# Patient Record
Sex: Male | Born: 1955 | Race: White | Hispanic: No | Marital: Married | Smoking: Current some day smoker
Health system: Southern US, Community
[De-identification: ages and names within clinical notes are randomized; demographics above are authoritative.]

## PROBLEM LIST (undated history)

## (undated) DIAGNOSIS — M1990 Unspecified osteoarthritis, unspecified site: Secondary | ICD-10-CM

## (undated) DIAGNOSIS — N529 Male erectile dysfunction, unspecified: Secondary | ICD-10-CM

## (undated) DIAGNOSIS — I10 Essential (primary) hypertension: Secondary | ICD-10-CM

## (undated) DIAGNOSIS — K766 Portal hypertension: Secondary | ICD-10-CM

## (undated) DIAGNOSIS — K7460 Unspecified cirrhosis of liver: Secondary | ICD-10-CM

## (undated) DIAGNOSIS — G4730 Sleep apnea, unspecified: Secondary | ICD-10-CM

## (undated) DIAGNOSIS — D126 Benign neoplasm of colon, unspecified: Secondary | ICD-10-CM

## (undated) DIAGNOSIS — L309 Dermatitis, unspecified: Secondary | ICD-10-CM

## (undated) DIAGNOSIS — I864 Gastric varices: Secondary | ICD-10-CM

## (undated) DIAGNOSIS — I8500 Esophageal varices without bleeding: Secondary | ICD-10-CM

## (undated) DIAGNOSIS — E785 Hyperlipidemia, unspecified: Secondary | ICD-10-CM

## (undated) HISTORY — DX: Essential (primary) hypertension: I10

## (undated) HISTORY — DX: Sleep apnea, unspecified: G47.30

## (undated) HISTORY — DX: Portal hypertension: K76.6

## (undated) HISTORY — DX: Unspecified cirrhosis of liver: K74.60

## (undated) HISTORY — DX: Morbid (severe) obesity due to excess calories: E66.01

## (undated) HISTORY — PX: INCISE AND DRAIN ABCESS: PRO64

## (undated) HISTORY — PX: TONSILLECTOMY: SUR1361

## (undated) HISTORY — PX: PARACENTESIS: SHX844

## (undated) HISTORY — DX: Unspecified osteoarthritis, unspecified site: M19.90

## (undated) HISTORY — DX: Benign neoplasm of colon, unspecified: D12.6

## (undated) HISTORY — DX: Esophageal varices without bleeding: I85.00

## (undated) HISTORY — DX: Male erectile dysfunction, unspecified: N52.9

## (undated) HISTORY — DX: Dermatitis, unspecified: L30.9

## (undated) HISTORY — DX: Gastric varices: I86.4

## (undated) HISTORY — DX: Hyperlipidemia, unspecified: E78.5

## (undated) SURGERY — Surgical Case
Anesthesia: *Unknown

---

## 2004-06-14 ENCOUNTER — Ambulatory Visit: Payer: Self-pay | Admitting: Family Medicine

## 2004-08-03 ENCOUNTER — Ambulatory Visit: Payer: Self-pay | Admitting: Family Medicine

## 2005-07-17 ENCOUNTER — Ambulatory Visit: Payer: Self-pay | Admitting: Family Medicine

## 2005-07-21 ENCOUNTER — Ambulatory Visit: Payer: Self-pay | Admitting: Family Medicine

## 2006-09-07 ENCOUNTER — Ambulatory Visit: Payer: Self-pay | Admitting: Family Medicine

## 2006-09-07 LAB — CONVERTED CEMR LAB
ALT: 64 units/L — ABNORMAL HIGH (ref 0–40)
AST: 59 units/L — ABNORMAL HIGH (ref 0–37)
Albumin: 3.5 g/dL (ref 3.5–5.2)
Alkaline Phosphatase: 102 units/L (ref 39–117)
Basophils Absolute: 0 10*3/uL (ref 0.0–0.1)
Basophils Relative: 0.6 % (ref 0.0–1.0)
Bilirubin, Direct: 0.2 mg/dL (ref 0.0–0.3)
CO2: 28 meq/L (ref 19–32)
Cholesterol: 184 mg/dL (ref 0–200)
Creatinine, Ser: 0.6 mg/dL (ref 0.4–1.5)
Creatinine,U: 131.1 mg/dL
Eosinophils Absolute: 0.2 10*3/uL (ref 0.0–0.6)
GFR calc non Af Amer: 151 mL/min
HCT: 43.2 % (ref 39.0–52.0)
Hemoglobin: 15.1 g/dL (ref 13.0–17.0)
Hgb A1c MFr Bld: 10.1 % — ABNORMAL HIGH (ref 4.6–6.0)
Lymphocytes Relative: 23.7 % (ref 12.0–46.0)
Monocytes Absolute: 0.6 10*3/uL (ref 0.2–0.7)
Neutro Abs: 3.3 10*3/uL (ref 1.4–7.7)
Neutrophils Relative %: 60.2 % (ref 43.0–77.0)
PSA: 0.15 ng/mL (ref 0.10–4.00)
Platelets: 101 10*3/uL — ABNORMAL LOW (ref 150–400)
TSH: 1.91 microintl units/mL (ref 0.35–5.50)
Total Bilirubin: 1 mg/dL (ref 0.3–1.2)
Total CHOL/HDL Ratio: 5.5
VLDL: 64 mg/dL — ABNORMAL HIGH (ref 0–40)
WBC: 5.4 10*3/uL (ref 4.5–10.5)

## 2006-09-14 ENCOUNTER — Ambulatory Visit: Payer: Self-pay | Admitting: Family Medicine

## 2006-10-23 ENCOUNTER — Ambulatory Visit: Payer: Self-pay | Admitting: Gastroenterology

## 2006-12-07 ENCOUNTER — Encounter: Payer: Self-pay | Admitting: Family Medicine

## 2006-12-07 DIAGNOSIS — S62609A Fracture of unspecified phalanx of unspecified finger, initial encounter for closed fracture: Secondary | ICD-10-CM | POA: Insufficient documentation

## 2006-12-07 DIAGNOSIS — I10 Essential (primary) hypertension: Secondary | ICD-10-CM | POA: Insufficient documentation

## 2006-12-07 DIAGNOSIS — G4733 Obstructive sleep apnea (adult) (pediatric): Secondary | ICD-10-CM

## 2006-12-07 DIAGNOSIS — Z8719 Personal history of other diseases of the digestive system: Secondary | ICD-10-CM

## 2006-12-07 DIAGNOSIS — S92919A Unspecified fracture of unspecified toe(s), initial encounter for closed fracture: Secondary | ICD-10-CM | POA: Insufficient documentation

## 2006-12-07 DIAGNOSIS — E119 Type 2 diabetes mellitus without complications: Secondary | ICD-10-CM

## 2006-12-07 DIAGNOSIS — E785 Hyperlipidemia, unspecified: Secondary | ICD-10-CM | POA: Insufficient documentation

## 2007-01-23 ENCOUNTER — Telehealth: Payer: Self-pay | Admitting: Family Medicine

## 2007-02-21 ENCOUNTER — Encounter: Payer: Self-pay | Admitting: Family Medicine

## 2007-07-05 ENCOUNTER — Encounter: Payer: Self-pay | Admitting: Family Medicine

## 2007-07-18 ENCOUNTER — Encounter: Payer: Self-pay | Admitting: Pulmonary Disease

## 2007-10-12 DIAGNOSIS — E669 Obesity, unspecified: Secondary | ICD-10-CM | POA: Insufficient documentation

## 2007-10-12 DIAGNOSIS — N200 Calculus of kidney: Secondary | ICD-10-CM

## 2007-12-27 ENCOUNTER — Telehealth: Payer: Self-pay | Admitting: Family Medicine

## 2008-02-06 ENCOUNTER — Ambulatory Visit: Payer: Self-pay | Admitting: Family Medicine

## 2008-02-06 DIAGNOSIS — F528 Other sexual dysfunction not due to a substance or known physiological condition: Secondary | ICD-10-CM | POA: Insufficient documentation

## 2008-02-06 DIAGNOSIS — Z87898 Personal history of other specified conditions: Secondary | ICD-10-CM

## 2008-02-06 DIAGNOSIS — L821 Other seborrheic keratosis: Secondary | ICD-10-CM

## 2008-02-07 LAB — CONVERTED CEMR LAB
AST: 57 units/L — ABNORMAL HIGH (ref 0–37)
Albumin: 3.5 g/dL (ref 3.5–5.2)
BUN: 9 mg/dL (ref 6–23)
Chloride: 103 meq/L (ref 96–112)
Creatinine, Ser: 0.7 mg/dL (ref 0.4–1.5)
Creatinine,U: 73.2 mg/dL
Eosinophils Relative: 4.2 % (ref 0.0–5.0)
Glucose, Bld: 157 mg/dL — ABNORMAL HIGH (ref 70–99)
HDL: 31.5 mg/dL — ABNORMAL LOW (ref >39.0)
Hgb A1c MFr Bld: 7.1 % — ABNORMAL HIGH (ref 4.6–6.0)
Lymphocytes Relative: 21.9 % (ref 12.0–46.0)
Microalb Creat Ratio: 19.1 mg/g (ref 0.0–30.0)
Monocytes Absolute: 0.5 10*3/uL (ref 0.1–1.0)
Monocytes Relative: 12 % (ref 3.0–12.0)
Neutro Abs: 2.6 10*3/uL (ref 1.4–7.7)
PSA: 0.12 ng/mL (ref 0.10–4.00)
Platelets: 72 10*3/uL — ABNORMAL LOW (ref 150–400)
Potassium: 4.1 meq/L (ref 3.5–5.1)
RBC: 4.07 M/uL — ABNORMAL LOW (ref 4.22–5.81)
RDW: 13.8 % (ref 11.5–14.6)
Total CHOL/HDL Ratio: 5.1
Total Protein: 8 g/dL (ref 6.0–8.3)
Triglycerides: 133 mg/dL (ref 0–149)
VLDL: 27 mg/dL (ref 0–40)

## 2008-02-12 ENCOUNTER — Telehealth: Payer: Self-pay | Admitting: Family Medicine

## 2008-02-13 ENCOUNTER — Ambulatory Visit: Payer: Self-pay

## 2008-02-13 ENCOUNTER — Ambulatory Visit: Payer: Self-pay | Admitting: Family Medicine

## 2008-02-13 DIAGNOSIS — L02419 Cutaneous abscess of limb, unspecified: Secondary | ICD-10-CM

## 2008-02-13 DIAGNOSIS — L03119 Cellulitis of unspecified part of limb: Secondary | ICD-10-CM

## 2008-02-13 DIAGNOSIS — M79609 Pain in unspecified limb: Secondary | ICD-10-CM | POA: Insufficient documentation

## 2008-02-26 ENCOUNTER — Ambulatory Visit: Payer: Self-pay | Admitting: Family Medicine

## 2008-02-26 DIAGNOSIS — M109 Gout, unspecified: Secondary | ICD-10-CM | POA: Insufficient documentation

## 2008-02-26 DIAGNOSIS — R609 Edema, unspecified: Secondary | ICD-10-CM | POA: Insufficient documentation

## 2008-06-16 ENCOUNTER — Ambulatory Visit: Payer: Self-pay | Admitting: Family Medicine

## 2008-06-17 ENCOUNTER — Telehealth: Payer: Self-pay | Admitting: Family Medicine

## 2008-06-18 ENCOUNTER — Ambulatory Visit: Payer: Self-pay

## 2008-06-18 ENCOUNTER — Encounter: Payer: Self-pay | Admitting: Family Medicine

## 2008-06-22 HISTORY — PX: COLONOSCOPY: SHX174

## 2008-06-24 ENCOUNTER — Ambulatory Visit: Payer: Self-pay | Admitting: Gastroenterology

## 2008-07-08 ENCOUNTER — Ambulatory Visit: Payer: Self-pay | Admitting: Gastroenterology

## 2008-07-08 ENCOUNTER — Encounter: Payer: Self-pay | Admitting: Gastroenterology

## 2008-07-08 DIAGNOSIS — D126 Benign neoplasm of colon, unspecified: Secondary | ICD-10-CM

## 2008-07-08 HISTORY — DX: Benign neoplasm of colon, unspecified: D12.6

## 2008-07-08 LAB — HM COLONOSCOPY

## 2008-07-10 ENCOUNTER — Encounter: Payer: Self-pay | Admitting: Gastroenterology

## 2008-10-20 ENCOUNTER — Ambulatory Visit: Payer: Self-pay | Admitting: Family Medicine

## 2008-10-20 DIAGNOSIS — J069 Acute upper respiratory infection, unspecified: Secondary | ICD-10-CM | POA: Insufficient documentation

## 2008-10-23 ENCOUNTER — Inpatient Hospital Stay (HOSPITAL_COMMUNITY): Admission: AD | Admit: 2008-10-23 | Discharge: 2008-10-26 | Payer: Self-pay | Admitting: Internal Medicine

## 2008-10-23 ENCOUNTER — Ambulatory Visit: Payer: Self-pay | Admitting: Family Medicine

## 2008-10-23 ENCOUNTER — Ambulatory Visit: Payer: Self-pay | Admitting: Internal Medicine

## 2008-11-12 ENCOUNTER — Encounter: Payer: Self-pay | Admitting: Family Medicine

## 2008-11-25 ENCOUNTER — Encounter: Payer: Self-pay | Admitting: Family Medicine

## 2009-06-24 ENCOUNTER — Telehealth: Payer: Self-pay | Admitting: Family Medicine

## 2009-06-24 ENCOUNTER — Ambulatory Visit: Payer: Self-pay | Admitting: Family Medicine

## 2009-06-24 LAB — CONVERTED CEMR LAB
Blood in Urine, dipstick: NEGATIVE
Ketones, urine, test strip: NEGATIVE
Specific Gravity, Urine: >=1.03
WBC Urine, dipstick: NEGATIVE
pH: 5.5

## 2009-06-25 ENCOUNTER — Ambulatory Visit: Payer: Self-pay | Admitting: Oncology

## 2009-06-25 LAB — CONVERTED CEMR LAB
AST: 52 units/L — ABNORMAL HIGH (ref 0–37)
Albumin: 3.7 g/dL (ref 3.5–5.2)
Alkaline Phosphatase: 75 units/L (ref 39–117)
BUN: 12 mg/dL (ref 6–23)
Basophils Absolute: 0 10*3/uL (ref 0.0–0.1)
CO2: 27 meq/L (ref 19–32)
Calcium: 9 mg/dL (ref 8.4–10.5)
Chloride: 105 meq/L (ref 96–112)
Cholesterol: 110 mg/dL (ref 0–200)
Creatinine, Ser: 0.6 mg/dL (ref 0.4–1.5)
Eosinophils Relative: 4 % (ref 0.0–5.0)
Hemoglobin: 12.8 g/dL — ABNORMAL LOW (ref 13.0–17.0)
Hgb A1c MFr Bld: 7.2 % — ABNORMAL HIGH (ref 4.6–6.5)
Lymphocytes Relative: 24.7 % (ref 12.0–46.0)
MCHC: 33.5 g/dL (ref 30.0–36.0)
Microalb Creat Ratio: 70.4 mg/g — ABNORMAL HIGH (ref 0.0–30.0)
Microalb, Ur: 8.9 mg/dL — ABNORMAL HIGH (ref 0.0–1.9)
Monocytes Absolute: 0.4 10*3/uL (ref 0.1–1.0)
Neutrophils Relative %: 56.6 % (ref 43.0–77.0)
Platelets: 41 10*3/uL — CL (ref 150.0–400.0)
RDW: 13.5 % (ref 11.5–14.6)

## 2009-07-14 ENCOUNTER — Encounter: Payer: Self-pay | Admitting: Family Medicine

## 2009-07-14 LAB — CBC WITH DIFFERENTIAL/PLATELET
BASO%: 0.4 % (ref 0.0–2.0)
Basophils Absolute: 0 10*3/uL (ref 0.0–0.1)
EOS%: 2.5 % (ref 0.0–7.0)
MCV: 93.9 fL (ref 79.3–98.0)
RBC: 4 10*6/uL — ABNORMAL LOW (ref 4.20–5.82)
RDW: 14.3 % (ref 11.0–14.6)
WBC: 4.5 10*3/uL (ref 4.0–10.3)
lymph#: 0.8 10*3/uL — ABNORMAL LOW (ref 0.9–3.3)

## 2009-07-14 LAB — TECHNOLOGIST REVIEW

## 2009-07-16 LAB — COMPREHENSIVE METABOLIC PANEL
ALT: 44 U/L (ref 0–53)
AST: 42 U/L — ABNORMAL HIGH (ref 0–37)
Albumin: 3.9 g/dL (ref 3.5–5.2)
Alkaline Phosphatase: 88 U/L (ref 39–117)
Calcium: 8.9 mg/dL (ref 8.4–10.5)
Potassium: 3.9 mEq/L (ref 3.5–5.3)
Sodium: 138 mEq/L (ref 135–145)
Total Bilirubin: 0.6 mg/dL (ref 0.3–1.2)

## 2009-07-16 LAB — SPEP & IFE WITH QIG
Albumin ELP: 47.6 % — ABNORMAL LOW (ref 55.8–66.1)
Alpha-1-Globulin: 3.7 % (ref 2.9–4.9)
IgG (Immunoglobin G), Serum: 1780 mg/dL — ABNORMAL HIGH (ref 694–1618)
Total Protein, Serum Electrophoresis: 7.8 g/dL (ref 6.0–8.3)

## 2009-07-16 LAB — VITAMIN B12: Vitamin B-12: 324 pg/mL (ref 211–911)

## 2009-07-16 LAB — KAPPA/LAMBDA LIGHT CHAINS: Kappa:Lambda Ratio: 0.56 (ref 0.26–1.65)

## 2009-07-16 LAB — LACTATE DEHYDROGENASE: LDH: 152 U/L (ref 94–250)

## 2009-07-19 ENCOUNTER — Ambulatory Visit: Payer: Self-pay | Admitting: Family Medicine

## 2009-07-19 DIAGNOSIS — M1990 Unspecified osteoarthritis, unspecified site: Secondary | ICD-10-CM | POA: Insufficient documentation

## 2009-07-26 ENCOUNTER — Ambulatory Visit (HOSPITAL_COMMUNITY): Admission: RE | Admit: 2009-07-26 | Discharge: 2009-07-26 | Payer: Self-pay | Admitting: Oncology

## 2009-07-28 ENCOUNTER — Ambulatory Visit: Payer: Self-pay | Admitting: Oncology

## 2009-07-30 ENCOUNTER — Encounter: Payer: Self-pay | Admitting: Family Medicine

## 2009-08-12 ENCOUNTER — Encounter: Admission: RE | Admit: 2009-08-12 | Discharge: 2009-08-12 | Payer: Self-pay | Admitting: Oncology

## 2009-08-31 ENCOUNTER — Ambulatory Visit: Payer: Self-pay | Admitting: Family Medicine

## 2009-08-31 ENCOUNTER — Inpatient Hospital Stay (HOSPITAL_COMMUNITY): Admission: EM | Admit: 2009-08-31 | Discharge: 2009-09-03 | Payer: Self-pay | Admitting: Emergency Medicine

## 2009-08-31 ENCOUNTER — Telehealth: Payer: Self-pay | Admitting: Family Medicine

## 2009-08-31 ENCOUNTER — Ambulatory Visit: Payer: Self-pay | Admitting: Cardiovascular Disease

## 2009-08-31 DIAGNOSIS — K7460 Unspecified cirrhosis of liver: Secondary | ICD-10-CM | POA: Insufficient documentation

## 2009-08-31 DIAGNOSIS — R180 Malignant ascites: Secondary | ICD-10-CM | POA: Insufficient documentation

## 2009-09-01 ENCOUNTER — Encounter (INDEPENDENT_AMBULATORY_CARE_PROVIDER_SITE_OTHER): Payer: Self-pay | Admitting: Internal Medicine

## 2009-09-02 ENCOUNTER — Ambulatory Visit: Payer: Self-pay | Admitting: Internal Medicine

## 2009-09-03 ENCOUNTER — Encounter (INDEPENDENT_AMBULATORY_CARE_PROVIDER_SITE_OTHER): Payer: Self-pay | Admitting: Internal Medicine

## 2009-09-03 ENCOUNTER — Encounter: Payer: Self-pay | Admitting: Internal Medicine

## 2009-09-06 ENCOUNTER — Encounter: Payer: Self-pay | Admitting: Internal Medicine

## 2009-09-14 ENCOUNTER — Ambulatory Visit: Payer: Self-pay | Admitting: Family Medicine

## 2009-09-14 LAB — CONVERTED CEMR LAB: Blood Glucose, Fingerstick: 347

## 2009-10-01 ENCOUNTER — Ambulatory Visit: Payer: Self-pay | Admitting: Gastroenterology

## 2009-10-04 LAB — CONVERTED CEMR LAB
ALT: 51 units/L (ref 0–53)
Albumin: 3.4 g/dL — ABNORMAL LOW (ref 3.5–5.2)
Basophils Relative: 0.6 % (ref 0.0–3.0)
CO2: 27 meq/L (ref 19–32)
Calcium: 9.1 mg/dL (ref 8.4–10.5)
Eosinophils Absolute: 0.2 10*3/uL (ref 0.0–0.7)
GFR calc non Af Amer: 108.48 mL/min (ref >60)
HCT: 41.4 % (ref 39.0–52.0)
Hemoglobin: 14.6 g/dL (ref 13.0–17.0)
MCHC: 35.3 g/dL (ref 30.0–36.0)
Neutro Abs: 3.1 10*3/uL (ref 1.4–7.7)
Neutrophils Relative %: 62.7 % (ref 43.0–77.0)
Platelets: 82 10*3/uL — ABNORMAL LOW (ref 150.0–400.0)
Prothrombin Time: 12.6 s — ABNORMAL HIGH (ref 9.1–11.7)
RBC: 4.45 M/uL (ref 4.22–5.81)
Sodium: 135 meq/L (ref 135–145)
Total Protein: 7.7 g/dL (ref 6.0–8.3)

## 2009-10-08 ENCOUNTER — Ambulatory Visit: Payer: Self-pay | Admitting: Gastroenterology

## 2009-11-02 ENCOUNTER — Ambulatory Visit: Payer: Self-pay | Admitting: Gastroenterology

## 2009-11-08 ENCOUNTER — Encounter (INDEPENDENT_AMBULATORY_CARE_PROVIDER_SITE_OTHER): Payer: Self-pay | Admitting: *Deleted

## 2009-12-14 ENCOUNTER — Ambulatory Visit: Payer: Self-pay | Admitting: Gastroenterology

## 2009-12-16 ENCOUNTER — Telehealth: Payer: Self-pay | Admitting: Family Medicine

## 2009-12-20 ENCOUNTER — Ambulatory Visit: Payer: Self-pay | Admitting: Family Medicine

## 2009-12-22 LAB — CONVERTED CEMR LAB: Hgb A1c MFr Bld: 12.6 % — ABNORMAL HIGH (ref 4.6–6.5)

## 2009-12-27 ENCOUNTER — Encounter (INDEPENDENT_AMBULATORY_CARE_PROVIDER_SITE_OTHER): Payer: Self-pay | Admitting: *Deleted

## 2010-01-25 LAB — CONVERTED CEMR LAB
GFR calc non Af Amer: 120.65 mL/min (ref >60)
Glucose, Bld: 444 mg/dL — ABNORMAL HIGH (ref 70–99)

## 2010-01-28 ENCOUNTER — Ambulatory Visit: Payer: Self-pay | Admitting: Gastroenterology

## 2010-01-31 LAB — CONVERTED CEMR LAB
BUN: 13 mg/dL
CO2: 29 meq/L
Calcium: 9.1 mg/dL
Chloride: 98 meq/L
Creatinine, Ser: 0.7 mg/dL
GFR calc non Af Amer: 124.58 mL/min
Glucose, Bld: 239 mg/dL — ABNORMAL HIGH
Potassium: 4.1 meq/L
Sodium: 137 meq/L

## 2010-02-23 ENCOUNTER — Telehealth: Payer: Self-pay | Admitting: Family Medicine

## 2010-03-04 ENCOUNTER — Ambulatory Visit: Payer: Self-pay | Admitting: Gastroenterology

## 2010-03-18 ENCOUNTER — Ambulatory Visit: Payer: Self-pay | Admitting: Gastroenterology

## 2010-04-01 ENCOUNTER — Ambulatory Visit: Payer: Self-pay | Admitting: Gastroenterology

## 2010-04-22 ENCOUNTER — Ambulatory Visit: Payer: Self-pay | Admitting: Gastroenterology

## 2010-06-21 NOTE — Progress Notes (Signed)
Summary: [facility]  Phone Note From Pharmacy   [facility]  [address] [facility] Summary of Call: [facility] lantus 60 units  chart says 50 units pls advise.  Initial call taken by: Pura Spice, RN,  February 23, 2010 5:03 PM  Follow-up for Phone Call        dr Bernell Haynie aware and [facility] notified 50units at nite.  Follow-up by: Pura Spice, RN,  February 23, 2010 5:11 PM

## 2010-06-21 NOTE — Assessment & Plan Note (Signed)
Review of gastrointestinal problems: 1. Tubular adenoma, colonoscopy Dr. Jarold Motto 06/1008; recall at 5 years. 2. Cirrhosis: diagnosed in 2011: Complicated by ascites, portal hypertension, esophageal and gastric varices that have never overtly bled, thrombocytopenia, elevated protime (INR 1.3). Laboratory workup March 2011: hepatitis A., B., C. negative. ANA negative. Normal ceruloplasmin. Alpha one antitrypsin level normal.  Ferritin, other iron studies normal.  Large-volume paracentesis March 2011; no bacterial peritonitis, + elevated SAAG. last imaging March, 2011: ultrasound and CT scan showed cirrhosis, splenomegaly, no clear discrete liver masses Last EGD March 2011: small gastric and esophageal varices, portal gastropathy, ulcer in duodenum, biopsies of stomach showed no H. pylori ( he was taking a lot of NSAIDs at the time)  last afp?   History of Present Illness Visit Type: Follow-up Visit Primary GI MD: Rob Bunting MD Primary Provider: Neomia Glass Requesting Provider: n/a Chief Complaint: Post-Hospital follow up History of Present Illness:     very pleasant 55 year old man whom I last saw about 3 years ago. This was for colon cancer screening. After that he had a colonoscopy with one of my partners for unclear reasons. He was recently in the hospital and he was diagnosed with cirrhosis after he presented with increasing abdominal girth, lower extremity edema.  He left the hospital 3-4 weeks ago.  He believes he's lost 50-75 pounds in past few weeks.  He's on diuretics.  100 aldactone, 40 lasix.  while hospitalized he had a large-volume paracentesis supporting portal hypertension without bacterial peritonitis. He had imaging studies as well as EGD, lab testing. Those results are all summarized above.           Current Medications (verified): 1)  Allopurinol 100 Mg Tabs (Allopurinol) .... Once Daily 2)  Epipen 0.3 Mg/0.34ml (1:1000)  Devi (Epinephrine Hcl (Anaphylaxis))  .... As Needed 3)  Cpap .... At Bedtime 4)  Triamcinolone Acetonide 0.1 % Crea (Triamcinolone Acetonide) .... Apply Two Times A Day As Needed 5)  Ferrous Sulfate 325 (65 Fe) Mg Tbec (Ferrous Sulfate) .Marland Kitchen.. 1 Once Daily 6)  Folic Acid 1 Mg Tabs (Folic Acid) .Marland Kitchen.. 1 Once Daily 7)  Lactulose 10 Gm/40ml Soln (Lactulose) .... 2 Tbsp Qd 8)  Protonix 40 Mg Tbec (Pantoprazole Sodium) .Marland Kitchen.. 1 Once Daily 9)  Senokot 8.6 Mg Tabs (Sennosides) .... 2 Once Daily 10)  Spironolactone 50 Mg Tabs (Spironolactone) .Marland Kitchen.. 1 Two Times A Day 11)  Thiamine Hcl 100 Mg Tabs (Thiamine Hcl) .Marland Kitchen.. 1 Once Daily 12)  Klor-Con 10 10 Meq Cr-Tabs (Potassium Chloride) .Marland Kitchen.. 1 Once Daily 13)  Furosemide 40 Mg Tabs (Furosemide) .Marland Kitchen.. 1 Once Daily 14)  Nadolol 20 Mg Tabs (Nadolol) .Marland Kitchen.. 1 Once Daily 15)  Milk Thistle 175 Mg Caps (Milk Thistle) .Marland Kitchen.. 1 Two Times A Day 16)  Novolog Mix 70/30 Flexpen 70-30 % Susp (Insulin Aspart Prot & Aspart) .... Use 10 Units With Breakfast and 20 With Supper 17)  Lantus Solostar 100 Unit/ml Soln (Insulin Glargine) .... Use 30 Units At Bedtime  Allergies (verified): 1)  ! * Bee Stings  Past History:  Past Medical History: Hypertension Diabetes mellitus, type II  Hyperlipidemia Gout morbid obesity sleep apnea eczema ED Osteoarthritis, especially in both knees, sees Dr. Hayden Rasmussen pancytopenia, sees Dr. Eli Hose Cirrhosis diagnosed spring 2011 with ascites, portal HTN, and esophageal varices  Past Surgical History: Tonsillectomy colonoscopy 07-08-08 per Dr. Jarold Motto, adenomatous  polyps, repeat in 5 yrs I and D of a right thigh abcess 10-24-08 per Dr. Ovidio Kin ( not MRSA)  Social History: Married Never Smoked Alcohol use-yes (1-2 glasses of wine a week)  Review of Systems       Pertinent positive and negative review of systems were noted in the above HPI and GI specific review of systems.  All other review of systems was otherwise negative.   Vital Signs:  Patient profile:    55 year old male Height:      68.5 inches Weight:      340 pounds BMI:     51.13 BSA:     2.58 Pulse rate:   112 / minute Pulse rhythm:   regular BP sitting:   110 / 68  (left arm)  Vitals Entered By: Merri Ray CMA Duncan Dull) (Oct 01, 2009 9:32 AM)  Physical Exam  Additional Exam:  Constitutional: morbidly obese, otherwise well appearing Psychiatric: alert and oriented times 3 Eyes: extraocular movements intact Mouth: oropharynx moist, no lesions Neck: supple, no lymphadenopathy Cardiovascular: heart regular rate and rythm Lungs: CTA bilaterally Abdomen: soft, non-tender, non-distended, no obvious ascites, no peritoneal signs, normal bowel sounds Extremities: trace to 1+ lower extremity edema bilaterally Skin: no lesions on visible extremities    Impression & Recommendations:  Problem # 1:  newly diagnosed cirrhosis unclear etiology to date however could be a combination of fatty liver disease, alcohol in the past. He would have basic set of labs including a complete metabolic profile, CBC, coags. We will adjust his diuretics as needed. I've made multiple medication recommendations form, see those listed below. He'll return to see me in 3-4 weeks.  Other Orders: TLB-CBC Platelet - w/Differential (85025-CBCD) TLB-CMP (Comprehensive Metabolic Pnl) (80053-COMP) TLB-PT (Protime) (85610-PTP)  Patient Instructions: 1)  You will get lab test(s) done today (cbc, cmet, inr). 2)  OK to take tylenol for periodic pains, never take more than 2 grams a day total. 3)  Absolutely stop drinking alcohol completely. 4)  Absoultely no NSAIDs. 5)  Start twinrx immunization for Hepatitis A, B. 6)  Return to see Dr. Christella Hartigan in 3-4 weeks. 7)  Stop the protonix, start OTC prilosec once daily. 8)  Stop the lactulose syrup, senekot. 9)  Will write prescriptions for all other necessary GI/liver meds. 10)  Stop the folate, thiamine, iron.  Start once daily mens mulivitamin. 11)  The medication  list was reviewed and reconciled.  All changed / newly prescribed medications were explained.  A complete medication list was provided to the patient / caregiver. Prescriptions: NADOLOL 20 MG TABS (NADOLOL) 1 once daily  #30 x 3   Entered by:   Chales Abrahams CMA (AAMA)   Authorized by:   Rachael Fee MD   Signed by:   Chales Abrahams CMA (AAMA) on 10/01/2009   Method used:   [facility]        [facility]  [address] [phone]* (retail)       [address]       [place], [place]  [address]       Ph: [phone]       Fax: [phone]   RxID:   [id number] FUROSEMIDE 40 MG TABS (FUROSEMIDE) 1 once daily  #30 x 3   Entered by:   Chales Abrahams CMA (AAMA)   Authorized by:   Rachael Fee MD   Signed by:   Chales Abrahams CMA (AAMA) on 10/01/2009   Method used:   [facility]        [facility]  [address] [phone]* (  retail)       [address]       [place], [place]  [address]       Ph: [phone]       Fax: [phone]   RxID:   [id number] KLOR-CON 10 10 MEQ CR-TABS (POTASSIUM CHLORIDE) 1 once daily  #30 x 3   Entered by:   Chales Abrahams CMA (AAMA)   Authorized by:   Rachael Fee MD   Signed by:   Chales Abrahams CMA (AAMA) on 10/01/2009   Method used:   [facility]        [facility]  [address] [phone]* (retail)       [address]       [place], [place]  [address]       Ph: [phone]       Fax: [phone]   RxID:   [id number] SPIRONOLACTONE 50 MG TABS (SPIRONOLACTONE) 1 two times a day  #60 x 3   Entered by:   Chales Abrahams CMA (AAMA)   Authorized by:   Rachael Fee MD   Signed by:   Chales Abrahams CMA (AAMA) on 10/01/2009   Method used:   [facility]        [facility]  [address] [phone]* (retail)       [address]       [place], [place]  [address]       Ph: [phone]       Fax: [phone]   RxID:   [id number]

## 2010-06-21 NOTE — Miscellaneous (Signed)
Summary: Twin Rix  Clinical Lists Changes  Orders: Added new Service order of TwinRix 1ml ( Hep A&B Adult dose) (95621) - Signed Added new Service order of Admin 1st Vaccine (30865) - Signed Observations: Added new observation of HQIONGE9 VIS: 02/07/07 version given Oct 01, 2009. (10/01/2009 10:31) Added new observation of TWINRIX1LOT#: [id number] (10/01/2009 10:31) Added new observation of TWINRIX1EXP: 07/31/2011 (10/01/2009 10:31) Added new observation of TWINRIX1BY: Chales Abrahams CMA (AAMA) (10/01/2009 10:31) Added new observation of [id number]: IM (10/01/2009 10:31) Added new observation of TWINRIX1DOSE: 1.0 ml (10/01/2009 10:31) Added new observation of UVOZDGU4 MFR: GlaxoSmithKline (10/01/2009 10:31) Added new observation of TWINRIX1SITE: left deltoid (10/01/2009 10:31) Added new observation of TWINRIX1GIVN: TwinRix (10/01/2009 10:31)      Orders Added: 1)  TwinRix 1ml ( Hep A&B Adult dose) [90636] 2)  Admin 1st Vaccine [90471]    Immunizations Administered:  TwinRix # 1:    Vaccine Type: TwinRix    Site: left deltoid    Mfr: GlaxoSmithKline    Dose: 1.0 ml    Route: IM    Given by: Chales Abrahams CMA (AAMA)    Exp. Date: 07/31/2011    Lot #: [id number]    VIS given: 02/07/07 version given Oct 01, 2009.

## 2010-06-21 NOTE — Assessment & Plan Note (Signed)
Summary: elevated sugars per dr panosh//ccm   Vital Signs:  Patient profile:   55 year old male Weight:      325 pounds Temp:     98.1 degrees F oral Pulse rate:   86 / minute BP sitting:   110 / 78  (left arm) Cuff size:   large  Vitals Entered By: Kathrynn Speed CMA (December 20, 2009 9:32 AM) CC: Elevated blood sugars per Dr. Lenox Ponds Is Patient Diabetic? Yes CBG Result 491  Does patient need assistance? Functional Status Self care   History of Present Illness: Here to follow up on poorly controlled insulin dependent type II DM. He has been seeing Dr. Christella Hartigan for cirrhosis and portal HTN, and he has been doing very well in that regard. He has lost a lot of weight, the edema is down, and his BP has been stable. Recent labs showed liver enzymes that are almost down to normal, and his renal function is excellent. His glucoses, however, have been quite elevated, often in the 400s. His am fasting glucoses are usually in the 200s.   Current Medications (verified): 1)  Allopurinol 100 Mg Tabs (Allopurinol) .... Once Daily 2)  Epipen 0.3 Mg/0.38ml (1:1000)  Devi (Epinephrine Hcl (Anaphylaxis)) .... As Needed 3)  Cpap .... At Bedtime 4)  Triamcinolone Acetonide 0.1 % Crea (Triamcinolone Acetonide) .... Apply Two Times A Day As Needed 5)  Spironolactone 50 Mg Tabs (Spironolactone) .Marland Kitchen.. 1 Two Times A Day 6)  Klor-Con 10 10 Meq Cr-Tabs (Potassium Chloride) .Marland Kitchen.. 1 Once Daily 7)  Furosemide 40 Mg Tabs (Furosemide) .Marland Kitchen.. 1 Once Daily 8)  Nadolol 20 Mg Tabs (Nadolol) .Marland Kitchen.. 1 Once Daily 9)  Milk Thistle 175 Mg Caps (Milk Thistle) .Marland Kitchen.. 1 Two Times A Day 10)  Novolog Mix 70/30 Flexpen 70-30 % Susp (Insulin Aspart Prot & Aspart) .... Use 10 Units With Breakfast 10 At Lunch and 20 With Supper 11)  Lantus Solostar 100 Unit/ml Soln (Insulin Glargine) .... Use 30 Units At Bedtime  Allergies (verified): 1)  ! * Bee Stings  Past History:  Past Medical History: Hypertension Diabetes mellitus, type II   Hyperlipidemia Gout morbid obesity sleep apnea eczema ED Osteoarthritis, especially in both knees, sees Dr. Hayden Rasmussen pancytopenia, sees Dr. Eli Hose Cirrhosis diagnosed spring 2011 with ascites, portal HTN, and esophageal varices, sees Dr. Wendall Papa  Past Surgical History: Reviewed history from 10/01/2009 and no changes required. Tonsillectomy colonoscopy 07-08-08 per Dr. Jarold Motto, adenomatous  polyps, repeat in 5 yrs I and D of a right thigh abcess 10-24-08 per Dr. Ovidio Kin ( not MRSA)  Review of Systems  The patient denies anorexia, fever, weight gain, vision loss, decreased hearing, hoarseness, chest pain, syncope, dyspnea on exertion, peripheral edema, prolonged cough, headaches, hemoptysis, abdominal pain, melena, hematochezia, severe indigestion/heartburn, hematuria, incontinence, genital sores, muscle weakness, suspicious skin lesions, transient blindness, difficulty walking, depression, unusual weight change, abnormal bleeding, enlarged lymph nodes, angioedema, breast masses, and testicular masses.    Physical Exam  General:  overweight-appearing.   Neck:  No deformities, masses, or tenderness noted. Lungs:  Normal respiratory effort, chest expands symmetrically. Lungs are clear to auscultation, no crackles or wheezes. Heart:  Normal rate and regular rhythm. S1 and S2 normal without gallop, murmur, click, rub or other extra sounds. Extremities:  1+ left pedal edema and 1+ right pedal edema.     Impression & Recommendations:  Problem # 1:  DIABETES MELLITUS, TYPE II (ICD-250.00)  His updated medication list for this problem  includes:    Novolog Mix 70/30 Flexpen 70-30 % Susp (Insulin aspart prot & aspart) ..... Use 20 units with breakfast,  20 at lunch,  and 40 with supper    Lantus Solostar 100 Unit/ml Soln (Insulin glargine) ..... Use 50 units at bedtime  Orders: Capillary Blood Glucose/CBG (69629) Venipuncture (52841) TLB-A1C / Hgb A1C (Glycohemoglobin)  (83036-A1C) TLB-Microalbumin/Creat Ratio, Urine (82043-MALB)  Problem # 2:  CIRRHOSIS (ICD-571.5)  His updated medication list for this problem includes:    Spironolactone 50 Mg Tabs (Spironolactone) .Marland Kitchen... 1 two times a day    Furosemide 40 Mg Tabs (Furosemide) .Marland Kitchen... 1 once daily    Nadolol 20 Mg Tabs (Nadolol) .Marland Kitchen... 1 once daily  Problem # 3:  LEG EDEMA (ICD-782.3)  His updated medication list for this problem includes:    Spironolactone 50 Mg Tabs (Spironolactone) .Marland Kitchen... 1 two times a day    Furosemide 40 Mg Tabs (Furosemide) .Marland Kitchen... 1 once daily  Complete Medication List: 1)  Allopurinol 100 Mg Tabs (Allopurinol) .... Once daily 2)  Epipen 0.3 Mg/0.21ml (1:1000) Devi (Epinephrine hcl (anaphylaxis)) .... As needed 3)  Cpap  .... At bedtime 4)  Triamcinolone Acetonide 0.1 % Crea (Triamcinolone acetonide) .... Apply two times a day as needed 5)  Spironolactone 50 Mg Tabs (Spironolactone) .Marland Kitchen.. 1 two times a day 6)  Klor-con 10 10 Meq Cr-tabs (Potassium chloride) .Marland Kitchen.. 1 once daily 7)  Furosemide 40 Mg Tabs (Furosemide) .Marland Kitchen.. 1 once daily 8)  Nadolol 20 Mg Tabs (Nadolol) .Marland Kitchen.. 1 once daily 9)  Milk Thistle 175 Mg Caps (Milk thistle) .Marland Kitchen.. 1 two times a day 10)  Novolog Mix 70/30 Flexpen 70-30 % Susp (Insulin aspart prot & aspart) .... Use 20 units with breakfast,  20 at lunch,  and 40 with supper 11)  Lantus Solostar 100 Unit/ml Soln (Insulin glargine) .... Use 50 units at bedtime  Patient Instructions: 1)  We will increase his insulin doses as above. Check labs today. He will call us with a list of glucose readings in one week.  Prescriptions: NOVOLOG MIX 70/30 FLEXPEN 70-30 % SUSP (INSULIN ASPART PROT & ASPART) use 20 units with breakfast,  20 at lunch,  and 40 with supper  #30 x 11   Entered and Authorized by:   Nelwyn Salisbury MD   Signed by:   Nelwyn Salisbury MD on 12/20/2009   Method used:   [facility]        [facility]  [address] [phone]* (retail)       [address]        [place], [place]  [address]       Ph: [phone]       Fax: [phone]   RxID:   [id number] LANTUS SOLOSTAR 100 UNIT/ML SOLN (INSULIN GLARGINE) use 50 units at bedtime  #30 x 11   Entered and Authorized by:   Nelwyn Salisbury MD   Signed by:   Nelwyn Salisbury MD on 12/20/2009   Method used:   [facility] to        [facility]  [address] [phone]* (retail)       [address]       [place], [place]  [address]       Ph: [phone]       Fax: [phone]   RxID:   [id number]   Appended Document: Orders Update Patient could not  give urine sample for testing due to using the bathroom before coming to the lab    Clinical Lists Changes

## 2010-06-21 NOTE — Letter (Signed)
Summary: Office Visit Letter  [facility]  [address] [place], [place] [address]   Phone: [phone]  Fax: [phone]      December 27, 2009 MRN: [id number]   Bobby Mcfarland [address] [place], [place]  [address]   Dear Bobby Mcfarland,   According to our records, it is time for you to schedule a follow-up office visit with [place] in the month of Oct 2011.   At your convenience, please call [phone] (option #2)to schedule an office visit. If you have any questions, concerns, or feel that this letter is in error, we would appreciate your call.   Sincerely,  Rachael Fee, M.D.  [facility] [phone]

## 2010-06-21 NOTE — Progress Notes (Signed)
Summary: REQ FOR RETURN CALL  Phone Note Call from Patient   Caller: Patient    Reason for Call: Talk to Doctor Summary of Call: Pt called to speak with Dr Clent Ridges.... Pt was advised that Dr Clent Ridges was unavailable.... Pt would like to speak with Dr Clent Ridges or Darel Hong, RN  ref to c/o bloating (occurring post contrast for CT)..... Pt was advised that msg / concern would be passed along.... Pt can be reached at [phone].  Initial call taken by: Debbra Riding,  August 31, 2009 8:05 AM  Follow-up for Phone Call        [facility] Follow-up by: Raechel Ache, RN,  August 31, 2009 8:14 AM  Additional Follow-up for Phone Call Additional follow up Details #1::        very bloated since CT abd/ contrast 3 1/2 weeks ago and looks like he's gained 40#- he's going to check with hematologist who ordered scan and will call back if he's referred here. Additional Follow-up by: Raechel Ache, RN,  August 31, 2009 8:29 AM    Additional Follow-up for Phone Call Additional follow up Details #2::    referred to PCP for bloating, wants to be seen today. Follow-up by: Raechel Ache, RN,  August 31, 2009 9:57 AM

## 2010-06-21 NOTE — Assessment & Plan Note (Signed)
Review of gastrointestinal problems: 1. Tubular adenoma, colonoscopy Dr. Jarold Motto 06/1008; recall at 5 years. 2. Cirrhosis: diagnosed in 2011: Complicated by ascites, portal hypertension, esophageal and gastric varices that have never overtly bled, thrombocytopenia, elevated protime (INR 1.3). Laboratory workup March 2011: hepatitis A., B., C. negative. ANA negative. Normal ceruloplasmin. Alpha one antitrypsin level normal.  Ferritin, other iron studies normal.  Large-volume paracentesis March 2011; no bacterial peritonitis, + elevated SAAG. last imaging March, 2011: ultrasound and CT scan showed cirrhosis, splenomegaly, no clear discrete liver masses Last EGD March 2011: small gastric and esophageal varices, portal gastropathy, ulcer in duodenum, biopsies of stomach showed no H. pylori ( he was taking a lot of NSAIDs at the time)  last afp?   History of Present Illness Visit Type: Follow-up Visit Primary GI MD: Rob Bunting MD Primary Provider: Neomia Glass Requesting Provider: n/a Chief Complaint: Follow up History of Present Illness:     very pleasant 55 year old man whom I last saw about 7-8 weeks ago. Since then he has been taking his diuretics daily. His weight is down 16 pounds since last visit 7-8 weeks ago.  has been feeling fine overall.  No confusion, disorientation, no overt bleeding.           Current Medications (verified): 1)  Allopurinol 100 Mg Tabs (Allopurinol) .... Once Daily 2)  Epipen 0.3 Mg/0.103ml (1:1000)  Devi (Epinephrine Hcl (Anaphylaxis)) .... As Needed 3)  Cpap .... At Bedtime 4)  Triamcinolone Acetonide 0.1 % Crea (Triamcinolone Acetonide) .... Apply Two Times A Day As Needed 5)  Spironolactone 50 Mg Tabs (Spironolactone) .Marland Kitchen.. 1 Two Times A Day 6)  Klor-Con 10 10 Meq Cr-Tabs (Potassium Chloride) .Marland Kitchen.. 1 Once Daily 7)  Furosemide 40 Mg Tabs (Furosemide) .Marland Kitchen.. 1 Once Daily 8)  Nadolol 20 Mg Tabs (Nadolol) .Marland Kitchen.. 1 Once Daily 9)  Milk Thistle 175 Mg Caps  (Milk Thistle) .Marland Kitchen.. 1 Two Times A Day 10)  Novolog Mix 70/30 Flexpen 70-30 % Susp (Insulin Aspart Prot & Aspart) .... Use 10 Units With Breakfast 10 At Lunch and 20 With Supper 11)  Lantus Solostar 100 Unit/ml Soln (Insulin Glargine) .... Use 30 Units At Bedtime  Allergies (verified): 1)  ! * Bee Stings  Vital Signs:  Patient profile:   55 year old male Height:      68.5 inches Weight:      324.6 pounds BMI:     48.81 Pulse rate:   88 / minute Pulse rhythm:   regular BP sitting:   112 / 64  (left arm) Cuff size:   regular  Vitals Entered By: Harlow Mares CMA Duncan Dull) (December 14, 2009 10:57 AM)  Physical Exam  Additional Exam:  Constitutional: generally well appearing, he is morbidly obese Psychiatric: alert and oriented times 3 Abdomen: soft, non-tender, non-distended, normal bowel sounds, no obvious ascites  extremities: trace lower extremity edema bilaterally    Impression & Recommendations:  Problem # 1:  cirrhosis he is on low to medium dose diuretics and is otherwise doing very well from a liver standpoint. He needs some labs to round out his workup for chronic liver disease including an AMA, anti-smooth muscle antibody, alpha-fetoprotein. He'll get a basic metabolic profile today as well and I may adjust his diuretics if needed. He will return to see me in 3 months and sooner if needed.  Other Orders: T-Alpha-Fetoprotein Serum (27741-28786) T-AMA [phone]) T-Anti SMA (62836-62947) TLB-BMP (Basic Metabolic Panel-BMET) (80048-METABOL)  Patient Instructions: 1)  You will get lab test(s) done  today (afp, bmet, ama, anti-smooth anitbody).  We may adjust your water pills based on these results. 2)  Return in 3 months. 3)  The medication list was reviewed and reconciled.  All changed / newly prescribed medications were explained.  A complete medication list was provided to the patient / caregiver.

## 2010-06-21 NOTE — Assessment & Plan Note (Signed)
Summary: twin rix # 3 ( 21-30 days)  Nurse Visit   Allergies: 1)  ! * Bee Stings  Immunizations Administered:  TwinRix # 3:    Vaccine Type: TwinRix    Site: left deltoid    Mfr: GlaxoSmithKline    Dose: 1.0 ml    Given by: Christie Nottingham CMA (AAMA)    Exp. Date: 02/26/2012    Lot #: [id number]    VIS given: 02/07/07 version given November 02, 2009. Flag sent to Patty to put a reminder in for booster Twinrix injection in a year.  Orders Added: 1)  TwinRix 1ml ( Hep A&B Adult dose) [90636] 2)  Admin 1st Vaccine [90471]

## 2010-06-21 NOTE — Letter (Signed)
[facility]   Imported By: Maryln Gottron 08/12/2009 13:35:08  _____________________________________________________________________  External Attachment:    Type:   Image     Comment:   External Document

## 2010-06-21 NOTE — Assessment & Plan Note (Signed)
Summary: BP / HR checked/pl  Nurse Visit   Vital Signs:  Patient profile:   55 year old male Pulse rate:   72 / minute Pulse rhythm:   regular BP sitting:   112 / 76  (left arm) Cuff size:   large  Vitals Entered By: Francee Piccolo CMA Duncan Dull) (April 01, 2010 8:23 AM)  If any changes need to be made in dosages of medications the pt would like "a detailed explanation" as to why the changes are being made if "everything is OK". Francee Piccolo CMA Duncan Dull)  April 01, 2010 8:25 AM   Appended Document: BP / HR checked/pl would like to have him increase his nadolol to 60 mg per day.  As his blood pressure medicine should be titrated as high as he will tolerate to decrease the chance that he will have bleeding from his esophageal varices. She still has room to go based on heart rate and blood pressure.  He may need a new prescription, have him take 20 mg pills, 3 of them every morning. Dispense 90, 3 refills. He will need heart rate and blood pressure checked again in 2-3 weeks in the office.  thanks   Appended Document: BP / HR checked/pl left message on machine to call back   Appended Document: BP / HR checked/pl pt aware rx sent and Nurse visit scheduled

## 2010-06-21 NOTE — Procedures (Signed)
Summary: Upper Endoscopy  Patient: Bobby Mcfarland Note: All result statuses are Final unless otherwise noted.  Tests: (1) Upper Endoscopy (EGD)   EGD Upper Endoscopy       DONE     [facility]     [address] [place], [place]  [address]           ENDOSCOPY PROCEDURE REPORT           PATIENT:  Bobby Mcfarland, Bobby Mcfarland  MR#:  [id number]     BIRTHDATE:  [birth year], 53 yrs. old  GENDER:  male           ENDOSCOPIST:  Hedwig Morton. Juanda Chance, MD     Referred by:  Bobby Mcfarland, M.D.           PROCEDURE DATE:  09/03/2009     PROCEDURE:  EGD with biopsy     ASA CLASS:  Class III     INDICATIONS:  hemeoccult positive stool advanced liver disease,     new diagnosis,,ascites,     r/o varices           MEDICATIONS:   Versed 5 mg, Fentanyl 62.5 mcg     TOPICAL ANESTHETIC:  Cetacaine Spray           DESCRIPTION OF PROCEDURE:   After the risks benefits and     alternatives of the procedure were thoroughly explained, informed     consent was obtained.  The EG-[id number] ([id number]) endoscope was     introduced through the mouth and advanced to the second portion of     the duodenum, without limitations.  The instrument was slowly     withdrawn as the mucosa was fully examined.     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     no stigmata of bleeding,no hemocustic spots  Grade I varices were     found in the fundus (see image009 and image008).  An ulcer was     found in the antrum. 3 mm clean base ulcer with surrounding edema     With standard forceps, a biopsy was obtained and sent to pathology     (see image011).  Gastropathy was found in the body of the stomach     (see image005 and image007).  Duodenitis was found (see image006).     edematous duodenal folds vs duodenal varices    Retroflexed views     revealed no abnormalities.    The scope was then withdrawn from  the patient and the procedure completed.           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  reendoscope and consider banding  of the varices as per DR Larae Grooms follow up           ______________________________     Hedwig Morton. Juanda Chance, MD           CC:           n.     eSIGNED:   Hedwig Morton. Brodie at 09/03/2009 08:52 AM           Chauncey Fischer, [id number]  Note: An exclamation mark (!) indicates a result that was not dispersed into the flowsheet. Document Creation Date: 09/03/2009 8:53 AM _______________________________________________________________________  (1) Order result status: Final Collection or observation date-time: 09/03/2009 08:38 Requested date-time:  Receipt date-time:  Reported date-time:  Referring Physician:   Ordering Physician: Lina Sar [phone]) Specimen Source:  Source: Launa Grill Order Number: [id number] Lab site:   Appended Document: Upper Endoscopy Recall is in IDX for  09/2009.

## 2010-06-21 NOTE — Letter (Signed)
Summary: Patient [facility] Biopsy Results  [facility]  [address] [place], [place] [address]   Phone: [phone]  Fax: [phone]        September 06, 2009 MRN: [id number]    Bobby Mcfarland [address] [place], [place]  [address]    Dear Mr. Bistline,  I am pleased to inform you that the biopsies taken during your recent endoscopic examination did not show any evidence of cancer upon pathologic examination.The ulcer is benign.  Additional information/recommendations:  __No further action is needed at this time.  Please follow-up with      your primary care physician for your other healthcare needs.  _x_ Please call [phone] to schedule a return visit to review      your condition.Please keep Your appointment with Dr Christella Hartigan.  _x_ Continue with the treatment plan as outlined on the day of your      exam.     Please call us if you are having persistent problems or have questions about your condition that have not been fully answered at this time.  Sincerely,  Hart Carwin MD  This letter has been electronically signed by your physician.  Appended Document: Patient Notice-Endo Biopsy Results (Appt. with Dr.Jacobs is scheduled for 10-01-09 at 9:45am.) Letter mailed to patient.

## 2010-06-21 NOTE — Assessment & Plan Note (Signed)
Summary: Twin Rix #2/pl  (7 day)  Nurse Visit   Allergies: 1)  ! * Bee Stings  Immunization History:  Hepatitis B Immunization History:    Hepatitis B # 2:  twinrix 1.0 ml (10/08/2009)  Immunizations Administered:  TwinRix # 2:    Vaccine Type: TwinRix    Site: right deltoid    Mfr: GlaxoSmithKline    Dose: 1.0 ml    Route: IM    Given by: Lamona Curl CMA (AAMA)    Lot #: [id number]    VIS given: 02/07/07 version given Oct 08, 2009.  Orders Added: 1)  TwinRix 1ml ( Hep A&B Adult dose) [90636] 2)  Admin 1st Vaccine [90471]

## 2010-06-21 NOTE — Assessment & Plan Note (Signed)
Summary: br hr check/pl  Nurse Visit   Vital Signs:  Patient profile:   55 year old male Pulse rate:   80 / minute Pulse rhythm:   regular BP sitting:   124 / 72  (left arm) Cuff size:   large  Vitals Entered By: Christie Nottingham CMA Duncan Dull) (March 18, 2010 8:30 AM)  Allergies: 1)  ! * Bee Stings  Appended Document: br hr check/pl patty, he needs to increase his nadolol to 60mg , once daily and then have visit for HR/BP check again in 2-3 weeks.    Appended Document: br hr check/pl pt aware and BP HR appt scheduled  Nadolol called to pharmacy

## 2010-06-21 NOTE — Consult Note (Signed)
[facility]   Imported By: Maryln Gottron 08/02/2009 11:05:39  _____________________________________________________________________  External Attachment:    Type:   Image     Comment:   External Document

## 2010-06-21 NOTE — Progress Notes (Signed)
Summary: Elevated BS Per Dr. Christella Hartigan  Phone Note Call from Patient Call back at Work Phone [phone]   Caller: Patient Summary of Call: Pt saw Dr. Christella Hartigan was advised that bs was extremely high was advised diabetes meds may need to be adjusted.  Please advise what med should be adjusted to, or if an appt w/ Dr. Clent Ridges is needed?   Initial call taken by: Trixie Dredge,  December 16, 2009 8:24 AM  Follow-up for Phone Call        call patient and see what he is taking and what his BGs ahve been before yesterday.  May need to  give extra  insulin   Can do Ov  today or omorrow with BG readings .  if still above 400 Follow-up by: Madelin Headings MD,  December 16, 2009 11:35 AM  Additional Follow-up for Phone Call Additional follow up Details #1::        LMTOCB Additional Follow-up by: Romualdo Bolk, CMA Duncan Dull),  December 16, 2009 11:37 AM    Additional Follow-up for Phone Call Additional follow up Details #2::    Pt cannot come until Monday.  He cannot test anymore due to financial matters.  The 400 BS was taken at Dr. Christella Hartigan office, and he wanted Dr. Clent Ridges to handle this.  Instructions?  [phone]  Follow-up by: Lynann Beaver CMA,  December 16, 2009 1:29 PM  Additional Follow-up for Phone Call Additional follow up Details #3:: Details for Additional Follow-up Action Taken: Increase Lantus to 35 units and f/u with Dr Clent Ridges Monday.  We are unable to be more aggressive with his meds if he is unable to monitor CBGs.  Notified pt. Lynann Beaver [facility]  December 16, 2009 2:17 PM  Additional Follow-up by: Evelena Peat MD,  December 16, 2009 2:14 PM

## 2010-06-21 NOTE — Miscellaneous (Signed)
Summary: Nadolol change  Clinical Lists Changes  Medications: Changed medication from NADOLOL 20 MG TABS (NADOLOL) take 2 pills, once daily to NADOLOL 40 MG TABS (NADOLOL) 1 1/2 by mouth once daily

## 2010-06-21 NOTE — Assessment & Plan Note (Signed)
Summary: extreme bloating after CT 3 weeks ago   Vital Signs:  Patient profile:   55 year old male O2 Sat:      96 % on Room air Temp:     98.0 degrees F oral BP sitting:   140 / 80  (left arm) Cuff size:   large  Vitals Entered By: Raechel Ache, RN (August 31, 2009 10:50 AM)  O2 Flow:  Room air CC: C/o bloating and SOB after CT 3 weeks ago and getting worse. Is Patient Diabetic? Yes   History of Present Illness: Here for a 40 lb. weight gain and swelling and bloating of his abdomen which has developed rapidly over the past 3 weeks. He saw Dr. Clelia Croft on 07-30-09 to evaluate some pancytopenia, and splenomegaly was seen on an [place]. 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 Some scattered adenopathy was seen which was not particularly suggestive of a metastatic process. Unfortunately he had not heard any report about this scan until I  told him today. I had not received a copy of this report, but we were able to retrieve it from [facility] today. Over the past several weeks his abdomen has rapidly gotten more swollen and tight, though he does not have pain. He is mildly SOB. His legs have swollen tremendously as well. BMs and urinations are normal. He reports that he drank alcohol heavily for years as a younger man, but he has cut back a lot. Now he may have a glass of wine each day with dinner.   Allergies: 1)  ! * Bee Stings  Past History:  Past Medical History: Reviewed history from 07/19/2009 and no changes required. Hypertension Diabetes mellitus, type II  Hyperlipidemia Gout morbid obesity sleep apnea eczema ED Osteoarthritis, especially in both knees, sees Dr. Hayden Rasmussen pancytopenia, sees Dr. Eli Hose  Past Surgical History: Reviewed history from 07/19/2009 and no changes required. Tonsillectomy colonoscopy 07-08-08 per Dr.  Jarold Motto, benign polyps, repeat in 5 yrs I and D of a right thigh abcess 10-24-08 per Dr. Ovidio Kin ( not MRSA)  Family History: Reviewed history from 02/26/2008 and no changes required. Family History Diabetes 1st degree relative dementia in grandmother  Social History: Reviewed history from 02/26/2008 and no changes required. Married Never Smoked Alcohol use-yes  Review of Systems  The patient denies anorexia, fever, weight loss, vision loss, decreased hearing, hoarseness, chest pain, syncope, prolonged cough, headaches, hemoptysis, abdominal pain, melena, hematochezia, severe indigestion/heartburn, hematuria, incontinence, genital sores, muscle weakness, suspicious skin lesions, transient blindness, difficulty walking, depression, unusual weight change, abnormal bleeding, enlarged lymph nodes, angioedema, breast masses, and testicular masses.    Physical Exam  General:  morbidly obese Neck:  No deformities, masses, or tenderness noted. Lungs:  Normal respiratory effort, chest expands symmetrically. Lungs are clear to auscultation, no crackles or wheezes. Heart:  Normal rate and regular rhythm. S1 and S2 normal without gallop, murmur, click, rub or other extra sounds. Abdomen:  tense and quite swollen. Unable to feel any organs. Not tender. edema extends around to the lower back Extremities:  4+ edema in both legs up to the hips   Impression & Recommendations:  Problem # 1:  ASCITES, MALIGNANT (ICD-789.51)  Problem # 2:  CIRRHOSIS (ICD-571.5)  Complete Medication List: 1)  Allopurinol 100 Mg Tabs (Allopurinol) .... Once daily 2)  Guanfacine Hcl 2  Mg Tabs (Guanfacine hcl) .... Once daily 3)  Avandamet 08-998 Mg Tabs (Rosiglitazone-metformin) .Marland Kitchen.. 1 by mouth two times a day 4)  Glipizide Xl 10 Mg Tb24 (Glipizide) .Marland Kitchen.. 1 by mouth two times a day 5)  Indomethacin 50 Mg Caps (Indomethacin) .... Three times a day as needed gout 6)  Epipen 0.3 Mg/0.2ml (1:1000) Devi (Epinephrine  hcl (anaphylaxis)) .... As needed 7)  Cpap  .... At bedtime 8)  Triamcinolone Acetonide 0.1 % Crea (Triamcinolone acetonide) .... Apply two times a day as needed 9)  Januvia 100 Mg Tabs (Sitagliptin phosphate) .... Take 1 tab by mouth daily 10)  Ramipril 5 Mg Caps (Ramipril) .Marland Kitchen.. 1 tablet by mouth daily 11)  Cialis 20 Mg Tabs (Tadalafil) .... As needed 12)  Bayer Aspirin 325 Mg Tabs (Aspirin) .... Once daily 13)  Simvastatin 40 Mg Tabs (Simvastatin) .... Once daily  Patient Instructions: 1)  He needs to be admitted for paracentesis, both for therapeutic and diagnostic reasons. The etiology of the cirrhosis needs to be worked up. h 2)  He will drive himself to the ER to be evaluated and treated by the ER staff.

## 2010-06-21 NOTE — Letter (Signed)
Summary: Endoscopy- Changed to Office Visit  [facility]  [address] [place], [place] [address]   Phone: [phone]  Fax: [phone]      November 08, 2009 MRN: [id number]   RYSON BACHA [address] [place], [place]  [address]   Dear Mr. Hinton,   According to our records, it is time for you to schedule an Endoscopy. However, after reviewing your medical record, I feel that an office visit would be most appropriate to more completely evaluate you and determine your need for a repeat procedure.  Please call [phone] (option #2) at your convenience to schedule an office visit. If you have any questions, concerns, or feel that this letter is in error, we would appreciate your call.   Sincerely,   Rachael Fee, M.D.  [facility] [phone]

## 2010-06-21 NOTE — Progress Notes (Signed)
Summary: [facility] called, Critical Lab, Platelet count 41  Phone Note From Other Clinic   Caller: Lori from [facility] "Critical" Call For: [place] Summary of Call: [facility] called  "critical lab for pt, platelet count 41.  Pt having CPX labs for 2/28 physical with Dr Clent Ridges. Initial call taken by: Sid Falcon LPN,  June 24, 2009 3:28 PM  Follow-up for Phone Call        Call from lab for Plt count of 41K.  Plts 72K 02-06-08.  Attempted to call patient but he was not at home.  He has scheduled CPE with Dr Clent Ridges later this month.  I will forward this note to him. Follow-up by: Evelena Peat MD,  June 24, 2009 5:37 PM  Additional Follow-up for Phone Call Additional follow up Details #1::        I am aware.  we will contact him today Additional Follow-up by: Nelwyn Salisbury MD,  June 25, 2009 8:28 AM

## 2010-06-21 NOTE — Assessment & Plan Note (Signed)
Summary: BP HR check/pl  Nurse Visit   Vital Signs:  Patient profile:   55 year old male BP sitting:   100 / 60  (left arm) PT WOULD LIKE TO KNOW IF ANY CHANGES IN HIS MEDICATIONS NEED TO BE MADE.  SCHEDULED PT FOR ANOTHER BP CHECK IN 4 WEEKS PATTY PLEASE CONTACT PT ON MONDAY  Allergies: 1)  ! * Bee Stings  Appended Document: BP HR check/pl needed blood pressure and HR (HR not taken).    patty, let him know that BP was good, no furhter changes to meds at this point.  he needs rov with me in early/mid february.  Appended Document: BP HR check/pl pt aware recall ROV in IDX and BP HR appt was cx

## 2010-06-21 NOTE — Assessment & Plan Note (Signed)
Summary: CPX/CJR   Vital Signs:  Patient profile:   55 year old male Height:      68.5 inches Weight:      360.2 pounds BMI:     54.17 Temp:     98.2 degrees F oral Pulse rate:   107 / minute BP sitting:   134 / 76  (left arm) Cuff size:   large  Vitals Entered By: Alfred Levins, CMA (July 19, 2009 10:08 AM) CC: cpx   History of Present Illness: 55 yr old male for cpx. In general he feels well. he recently got a cortisone shot in the right knee, and this has successfuly eased his pain for awhile. He is trying to walk for exercise and watch his diet. We discovered some pancytopenia on his cpx labs with a WBC of 2.8 and platelets at 41 K. He saw Dr. Clelia Croft last week, and these had improved on repeat resting with a WBC of 4 or so and platelets at 69 K. he has more labs and an abdominal US pending later this week.   Current Medications (verified): 1)  Allopurinol 100 Mg Tabs (Allopurinol) .... Once Daily 2)  Guanfacine Hcl 2 Mg Tabs (Guanfacine Hcl) .... Once Daily 3)  Avandamet 08-998 Mg Tabs (Rosiglitazone-Metformin) .Marland Kitchen.. 1 By Mouth Two Times A Day 4)  Glipizide Xl 10 Mg Tb24 (Glipizide) .Marland Kitchen.. 1 By Mouth Two Times A Day 5)  Indomethacin 50 Mg Caps (Indomethacin) .... Three Times A Day As Needed Gout 6)  Epipen 0.3 Mg/0.52ml (1:1000)  Devi (Epinephrine Hcl (Anaphylaxis)) .... Prn 7)  Cpap .... At Bedtime 8)  Triamcinolone Acetonide 0.1 % Crea (Triamcinolone Acetonide) .... Apply Bid To Affected Area 9)  Januvia 100 Mg Tabs (Sitagliptin Phosphate) .... Take 1 Tab By Mouth Daily 10)  Ramipril 5 Mg  Caps (Ramipril) .Marland Kitchen.. 1 Tablet By Mouth Daily 11)  Cialis 20 Mg Tabs (Tadalafil) .... As Needed 12)  Bayer Aspirin 325 Mg Tabs (Aspirin) .... Once Daily 13)  Simvastatin 40 Mg Tabs (Simvastatin) .... Once Daily  Allergies (verified): 1)  ! * Bee Stings  Past History:  Past Medical History: Hypertension Diabetes mellitus, type II  Hyperlipidemia Gout morbid obesity sleep  apnea eczema ED Osteoarthritis, especially in both knees, sees Dr. Hayden Rasmussen pancytopenia, sees Dr. Eli Hose  Past Surgical History: Tonsillectomy colonoscopy 07-08-08 per Dr. Jarold Motto, benign polyps, repeat in 5 yrs I and D of a right thigh abcess 10-24-08 per Dr. Ovidio Kin ( not MRSA)  Family History: Reviewed history from 02/26/2008 and no changes required. Family History Diabetes 1st degree relative dementia in grandmother  Social History: Reviewed history from 02/26/2008 and no changes required. Married Never Smoked Alcohol use-yes  Review of Systems  The patient denies anorexia, fever, weight loss, vision loss, decreased hearing, hoarseness, chest pain, syncope, dyspnea on exertion, peripheral edema, prolonged cough, headaches, hemoptysis, abdominal pain, melena, hematochezia, severe indigestion/heartburn, hematuria, incontinence, genital sores, muscle weakness, suspicious skin lesions, transient blindness, difficulty walking, depression, unusual weight change, abnormal bleeding, enlarged lymph nodes, angioedema, breast masses, and testicular masses.    Physical Exam  General:  morbidly obese Head:  Normocephalic and atraumatic without obvious abnormalities. No apparent alopecia or balding. Eyes:  No corneal or conjunctival inflammation noted. EOMI. Perrla. Funduscopic exam benign, without hemorrhages, exudates or papilledema. Vision grossly normal. Ears:  External ear exam shows no significant lesions or deformities.  Otoscopic examination reveals clear canals, tympanic membranes are intact bilaterally without bulging, retraction, inflammation or discharge. Hearing  is grossly normal bilaterally. Nose:  External nasal examination shows no deformity or inflammation. Nasal mucosa are pink and moist without lesions or exudates. Mouth:  Oral mucosa and oropharynx without lesions or exudates.  Teeth in good repair. Neck:  No deformities, masses, or tenderness noted. Chest  Wall:  No deformities, masses, tenderness or gynecomastia noted. Lungs:  Normal respiratory effort, chest expands symmetrically. Lungs are clear to auscultation, no crackles or wheezes. Heart:  Normal rate and regular rhythm. S1 and S2 normal without gallop, murmur, click, rub or other extra sounds. EKG normal  Abdomen:  Bowel sounds positive,abdomen soft and non-tender without masses, organomegaly or hernias noted. Rectal:  No external abnormalities noted. Normal sphincter tone. No rectal masses or tenderness. Heme neg Genitalia:  Testes bilaterally descended without nodularity, tenderness or masses. No scrotal masses or lesions. No penis lesions or urethral discharge. Prostate:  Prostate gland firm and smooth, no enlargement, nodularity, tenderness, mass, asymmetry or induration. Msk:  No deformity or scoliosis noted of thoracic or lumbar spine.   Pulses:  R and L carotid,radial,femoral,dorsalis pedis and posterior tibial pulses are full and equal bilaterally Extremities:  No clubbing, cyanosis, edema, or deformity noted with normal full range of motion of all joints.   Neurologic:  No cranial nerve deficits noted. Station and gait are normal. Plantar reflexes are down-going bilaterally. DTRs are symmetrical throughout. Sensory, motor and coordinative functions appear intact. Skin:  Intact without suspicious lesions or rashes Cervical Nodes:  No lymphadenopathy noted Axillary Nodes:  No palpable lymphadenopathy Inguinal Nodes:  No significant adenopathy Psych:  Cognition and judgment appear intact. Alert and cooperative with normal attention span and concentration. No apparent delusions, illusions, hallucinations   Impression & Recommendations:  Problem # 1:  WELL ADULT EXAM (ICD-V70.0)  Orders: Hemoccult Guaiac-1 spec.(in office) (82270) EKG w/ Interpretation (93000)  Complete Medication List: 1)  Allopurinol 100 Mg Tabs (Allopurinol) .... Once daily 2)  Guanfacine Hcl 2 Mg Tabs  (Guanfacine hcl) .... Once daily 3)  Avandamet 08-998 Mg Tabs (Rosiglitazone-metformin) .Marland Kitchen.. 1 by mouth two times a day 4)  Glipizide Xl 10 Mg Tb24 (Glipizide) .Marland Kitchen.. 1 by mouth two times a day 5)  Indomethacin 50 Mg Caps (Indomethacin) .... Three times a day as needed gout 6)  Epipen 0.3 Mg/0.7ml (1:1000) Devi (Epinephrine hcl (anaphylaxis)) .... As needed 7)  Cpap  .... At bedtime 8)  Triamcinolone Acetonide 0.1 % Crea (Triamcinolone acetonide) .... Apply two times a day as needed 9)  Januvia 100 Mg Tabs (Sitagliptin phosphate) .... Take 1 tab by mouth daily 10)  Ramipril 5 Mg Caps (Ramipril) .Marland Kitchen.. 1 tablet by mouth daily 11)  Cialis 20 Mg Tabs (Tadalafil) .... As needed 12)  Bayer Aspirin 325 Mg Tabs (Aspirin) .... Once daily 13)  Simvastatin 40 Mg Tabs (Simvastatin) .... Once daily  Patient Instructions: 1)  It is important that you exercise reguarly at least 20 minutes 5 times a week. If you develop chest pain, have severe difficulty breathing, or feel very tired, stop exercising immediately and seek medical attention.  2)  You need to lose weight. Consider a lower calorie diet and regular exercise.  3)  Follow up soon with Dr. Clelia Croft. Prescriptions: SIMVASTATIN 40 MG TABS (SIMVASTATIN) once daily  #90 x 3   Entered and Authorized by:   Nelwyn Salisbury MD   Signed by:   Nelwyn Salisbury MD on 07/19/2009   Method used:   Print then Give to Patient   RxID:  [id number] RAMIPRIL 5 MG  CAPS (RAMIPRIL) 1 tablet by mouth daily  #90 x 3   Entered and Authorized by:   Nelwyn Salisbury MD   Signed by:   Nelwyn Salisbury MD on 07/19/2009   Method used:   Print then Give to Patient   RxID:   [id number] JANUVIA 100 MG TABS (SITAGLIPTIN PHOSPHATE) Take 1 tab by mouth daily  #90 x 3   Entered and Authorized by:   Nelwyn Salisbury MD   Signed by:   Nelwyn Salisbury MD on 07/19/2009   Method used:   Print then Give to Patient   RxID:   [id number] INDOMETHACIN 50 MG CAPS (INDOMETHACIN) three times  a day as needed gout  #270 x 3   Entered and Authorized by:   Nelwyn Salisbury MD   Signed by:   Nelwyn Salisbury MD on 07/19/2009   Method used:   Print then Give to Patient   RxID:   [id number] GLIPIZIDE XL 10 MG TB24 (GLIPIZIDE) 1 by mouth two times a day  #180 x 3   Entered and Authorized by:   Nelwyn Salisbury MD   Signed by:   Nelwyn Salisbury MD on 07/19/2009   Method used:   Print then Give to Patient   RxID:   [id number] AVANDAMET 08-998 MG TABS (ROSIGLITAZONE-METFORMIN) 1 by mouth two times a day  #180 x 3   Entered and Authorized by:   Nelwyn Salisbury MD   Signed by:   Nelwyn Salisbury MD on 07/19/2009   Method used:   Print then Give to Patient   RxID:   [id number] GUANFACINE HCL 2 MG TABS (GUANFACINE HCL) once daily  #90 x 3   Entered and Authorized by:   Nelwyn Salisbury MD   Signed by:   Nelwyn Salisbury MD on 07/19/2009   Method used:   Print then Give to Patient   RxID:   [id number] ALLOPURINOL 100 MG TABS (ALLOPURINOL) once daily  #90 x 3   Entered and Authorized by:   Nelwyn Salisbury MD   Signed by:   Nelwyn Salisbury MD on 07/19/2009   Method used:   Print then Give to Patient   RxID:   [id number] SIMVASTATIN 40 MG TABS (SIMVASTATIN) once daily  #30 x 0   Entered and Authorized by:   Nelwyn Salisbury MD   Signed by:   Nelwyn Salisbury MD on 07/19/2009   Method used:   Electronically to        [facility] [phone]* (retail)       [address]       [place], [place]  [address]       Ph: [phone]       Fax: [phone]   RxID:   [id number] RAMIPRIL 5 MG  CAPS (RAMIPRIL) 1 tablet by mouth daily  #30 x 0   Entered and Authorized by:   Nelwyn Salisbury MD   Signed by:   Nelwyn Salisbury MD on 07/19/2009   Method used:   Electronically to        [facility] [phone]* (retail)       [address]       [place], [place]  [address]       Ph: [phone]  Fax: [phone]   RxID:    [id number] JANUVIA 100 MG TABS (SITAGLIPTIN PHOSPHATE) Take 1 tab by mouth daily  #30 x 0   Entered and Authorized by:   Nelwyn Salisbury MD   Signed by:   Nelwyn Salisbury MD on 07/19/2009   Method used:   Electronically to        [facility] [phone]* (retail)       [address]       [place], [place]  [address]       Ph: [phone]       Fax: [phone]   RxID:   [id number] TRIAMCINOLONE ACETONIDE 0.1 % CREA (TRIAMCINOLONE ACETONIDE) apply two times a day as needed  #60 x 5   Entered and Authorized by:   Nelwyn Salisbury MD   Signed by:   Nelwyn Salisbury MD on 07/19/2009   Method used:   Electronically to        [facility] [phone]* (retail)       [address]       [place], [place]  [address]       Ph: [phone]       Fax: [phone]   RxID:   [id number] INDOMETHACIN 50 MG CAPS (INDOMETHACIN) three times a day as needed gout  #90 x 0   Entered and Authorized by:   Nelwyn Salisbury MD   Signed by:   Nelwyn Salisbury MD on 07/19/2009   Method used:   Electronically to        [facility] [phone]* (retail)       [address]       [place], [place]  [address]       Ph: [phone]       Fax: [phone]   RxID:   [id number] GLIPIZIDE XL 10 MG TB24 (GLIPIZIDE) 1 by mouth two times a day  #60 x 0   Entered and Authorized by:   Nelwyn Salisbury MD   Signed by:   Nelwyn Salisbury MD on 07/19/2009   Method used:   Electronically to        [facility] [phone]* (retail)       [address]       [place], [place]  [address]       Ph: [phone]       Fax: [phone]   RxID:   [id number] AVANDAMET 08-998 MG TABS (ROSIGLITAZONE-METFORMIN) 1 by mouth two times a day  #60 x 0   Entered and Authorized by:   Nelwyn Salisbury MD   Signed by:   Nelwyn Salisbury MD on 07/19/2009   Method used:   Electronically to        [facility] [phone]* (retail)       [address]       [place], [place]  [address]       Ph: [phone]       Fax: [phone]   RxID:   [id number] GUANFACINE HCL 2 MG TABS (GUANFACINE HCL) once daily  #30 x 0   Entered and Authorized by:   Nelwyn Salisbury MD  Signed by:   Nelwyn Salisbury MD on 07/19/2009   Method used:   Electronically to        [phone] (retail)       [address]       [place], [place]  [address]       Ph: [phone]       Fax: [phone]   RxID:   [id number] ALLOPURINOL 100 MG TABS (ALLOPURINOL) once daily  #30 x 0   Entered and Authorized by:   Nelwyn Salisbury MD   Signed by:   Nelwyn Salisbury MD on 07/19/2009   Method used:   Electronically to        [facility] [phone]* (retail)       [address]       [place], [place]  [address]       Ph: [phone]       Fax: [phone]   RxID:   [id number] EPIPEN 0.3 MG/0.3ML (1:1000)  DEVI (EPINEPHRINE HCL (ANAPHYLAXIS)) as needed  #2 x 5   Entered and Authorized by:   Nelwyn Salisbury MD   Signed by:   Nelwyn Salisbury MD on 07/19/2009   Method used:   Electronically to        [facility] [phone]* (retail)       [address]       [place], [place]  [address]       Ph: [phone]       Fax: [phone]   RxID:   [id number]

## 2010-06-21 NOTE — Assessment & Plan Note (Signed)
Summary: F/U ON MEDS   Vital Signs:  Patient profile:   55 year old male Weight:      341 pounds BMI:     51.28 O2 Sat:      95 % on Room air Temp:     98.3 degrees F oral BP sitting:   126 / 78  (left arm) Cuff size:   large  Vitals Entered By: Raechel Ache, RN (September 14, 2009 10:02 AM)  O2 Flow:  Room air CC: Hosp f/u, feels better. Is Patient Diabetic? Yes CBG Result 347   History of Present Illness: Here after a hospital stay from 08-31-09 to 09-03-09 for ascites, portal HTN, and cirrhosis. He was seen by Dr. Juanda Chance for paracentesis, and 6 liters of fluid was removed. Upper endoscopy revealed esophageal varices and a gastric ulcer. He tested negative for hepatitis B, but his results for hep. C were still pending. His diabetes was poorly controlled, and his Glipizide was withheld. ECHO showed 55-60% EF. Now that the ascites has been reduced he feels much better. He has more energy, and he is breathing much more easily. He is back to work this week. He watches his diet closely, especially for sodium and for carbohydrates, however his glucoses remain quite elevated. His A1c in the hospital was onoy 7.3, but a random glucose here this am is 347.   Allergies: 1)  ! * Bee Stings  Past History:  Past Medical History: Hypertension Diabetes mellitus, type II  Hyperlipidemia Gout morbid obesity sleep apnea eczema ED Osteoarthritis, especially in both knees, sees Dr. Hayden Rasmussen pancytopenia, sees Dr. Eli Hose Cirrhosis with ascites, portal HTN, and esophageal varices  Past Surgical History: Reviewed history from 07/19/2009 and no changes required. Tonsillectomy colonoscopy 07-08-08 per Dr. Jarold Motto, benign polyps, repeat in 5 yrs I and D of a right thigh abcess 10-24-08 per Dr. Ovidio Kin ( not MRSA)  Review of Systems  The patient denies anorexia, fever, weight loss, weight gain, vision loss, decreased hearing, hoarseness, chest pain, syncope, dyspnea on exertion,  peripheral edema, prolonged cough, headaches, hemoptysis, abdominal pain, melena, hematochezia, severe indigestion/heartburn, hematuria, incontinence, genital sores, muscle weakness, suspicious skin lesions, transient blindness, difficulty walking, depression, unusual weight change, abnormal bleeding, enlarged lymph nodes, angioedema, breast masses, and testicular masses.    Physical Exam  General:  overweight-appearing.   Neck:  No deformities, masses, or tenderness noted. Lungs:  Normal respiratory effort, chest expands symmetrically. Lungs are clear to auscultation, no crackles or wheezes. Heart:  Normal rate and regular rhythm. S1 and S2 normal without gallop, murmur, click, rub or other extra sounds. Abdomen:  Bowel sounds positive,abdomen soft and non-tender without masses, organomegaly or hernias noted.   Impression & Recommendations:  Problem # 1:  CIRRHOSIS (ICD-571.5)  His updated medication list for this problem includes:    Lactulose 10 Gm/59ml Soln (Lactulose) .Marland Kitchen... 2 tbsp qd    Senokot 8.6 Mg Tabs (Sennosides) .Marland Kitchen... 2 once daily    Spironolactone 50 Mg Tabs (Spironolactone) .Marland Kitchen... 1 two times a day    Furosemide 40 Mg Tabs (Furosemide) .Marland Kitchen... 1 once daily    Nadolol 20 Mg Tabs (Nadolol) .Marland Kitchen... 1 once daily  Problem # 2:  ASCITES, MALIGNANT (ICD-789.51)  Problem # 3:  MORBID OBESITY (ICD-278.01)  Problem # 5:  DIABETES MELLITUS, TYPE II (ICD-250.00)  The following medications were removed from the medication list:    Avandamet 08-998 Mg Tabs (Rosiglitazone-metformin) .Marland Kitchen... 1 by mouth two times a day  Glipizide Xl 10 Mg Tb24 (Glipizide) .Marland Kitchen... 1 by mouth two times a day    Januvia 100 Mg Tabs (Sitagliptin phosphate) .Marland Kitchen... Take 1 tab by mouth daily    Ramipril 5 Mg Caps (Ramipril) .Marland Kitchen... 1 tablet by mouth daily    Bayer Aspirin 325 Mg Tabs (Aspirin) ..... Once daily His updated medication list for this problem includes:    Novolog Mix 70/30 Flexpen 70-30 % Susp (Insulin  aspart prot & aspart) ..... Use 10 units with breakfast and 20 with supper    Lantus Solostar 100 Unit/ml Soln (Insulin glargine) ..... Use 30 units at bedtime  Orders: Capillary Blood Glucose/CBG (44010) Diabetic Clinic Referral (Diabetic)  Complete Medication List: 1)  Allopurinol 100 Mg Tabs (Allopurinol) .... Once daily 2)  Epipen 0.3 Mg/0.48ml (1:1000) Devi (Epinephrine hcl (anaphylaxis)) .... As needed 3)  Cpap  .... At bedtime 4)  Triamcinolone Acetonide 0.1 % Crea (Triamcinolone acetonide) .... Apply two times a day as needed 5)  Ferrous Sulfate 325 (65 Fe) Mg Tbec (Ferrous sulfate) .Marland Kitchen.. 1 once daily 6)  Folic Acid 1 Mg Tabs (Folic acid) .Marland Kitchen.. 1 once daily 7)  Lactulose 10 Gm/78ml Soln (Lactulose) .... 2 tbsp qd 8)  Protonix 40 Mg Tbec (Pantoprazole sodium) .Marland Kitchen.. 1 once daily 9)  Senokot 8.6 Mg Tabs (Sennosides) .... 2 once daily 10)  Spironolactone 50 Mg Tabs (Spironolactone) .Marland Kitchen.. 1 two times a day 11)  Thiamine Hcl 100 Mg Tabs (Thiamine hcl) .Marland Kitchen.. 1 once daily 12)  Klor-con 10 10 Meq Cr-tabs (Potassium chloride) .Marland Kitchen.. 1 once daily 13)  Furosemide 40 Mg Tabs (Furosemide) .Marland Kitchen.. 1 once daily 14)  Nadolol 20 Mg Tabs (Nadolol) .Marland Kitchen.. 1 once daily 15)  Milk Thistle 175 Mg Caps (Milk thistle) .Marland Kitchen.. 1 two times a day 16)  Novolog Mix 70/30 Flexpen 70-30 % Susp (Insulin aspart prot & aspart) .... Use 10 units with breakfast and 20 with supper 17)  Lantus Solostar 100 Unit/ml Soln (Insulin glargine) .... Use 30 units at bedtime  Patient Instructions: 1)  We will stop all oral diabetic meds and switch him over to insulin. This will afford much better diabetic control, and we can avoid any potential hepatic or renal side effects. Start on [employer] and Lantus. Will send him for diabetic nurse teaching ASAP. he will get his own glucometer and test 4 times a day. To see me again in 3 weeks. To see Dr. Christella Hartigan on 10-06-09.  Prescriptions: LANTUS SOLOSTAR 100 UNIT/ML SOLN (INSULIN GLARGINE) use 30  units at bedtime  #1 x 0   Entered and Authorized by:   Nelwyn Salisbury MD   Signed by:   Nelwyn Salisbury MD on 09/14/2009   Method used:   [facility]ron[facility] to        [facility] [phone]* (retail)       [address]       [place], [place]  [address]       Ph: [phone]       Fax: [phone]   RxID:   [id number] NOVOLOG MIX 70/30 FLEXPEN 70-30 % SUSP (INSULIN ASPART PROT & ASPART) use 10 units with breakfast and 20 with supper  #1 x 2   Entered and Authorized by:   Nelwyn Salisbury MD   Signed by:   Nelwyn Salisbury MD on 09/14/2009   Method used:   [facility]ron[facility] to        [id number] ([facility]ail)       [address]       [place], [place]  [address]       Ph: [phone]       Fax: [phone]   RxID:   [id number]

## 2010-06-21 NOTE — Assessment & Plan Note (Signed)
Review of gastrointestinal problems: 1. Tubular adenoma, colonoscopy Dr. Jarold Motto 06/1008; recall at 5 years. 2. Cirrhosis: diagnosed in 2011: Complicated by ascites, portal hypertension, esophageal and gastric varices that have never overtly bled, thrombocytopenia, elevated protime (INR 1.3). Laboratory workup March 2011: hepatitis A., B., C. negative. ANA negative. Normal ceruloplasmin. Alpha one antitrypsin level normal.  Ferritin, other iron studies normal.  AMA negative.  ASMA negative.  Large-volume paracentesis March 2011; no bacterial peritonitis, + elevated SAAG. last imaging March, 2011: ultrasound and CT scan showed cirrhosis, splenomegaly, no clear discrete liver masses Last EGD March 2011: small gastric and esophageal varices, portal gastropathy, ulcer in duodenum, biopsies of stomach showed no H. pylori ( he was taking a lot of NSAIDs at the time).  On Nadolol, increased to 40mg  a day in oct 2011. last afp July 2011 normal started hep A/B immunizations in 10/2009     History of Present Illness Visit Type: Follow-up Visit Primary GI MD: Rob Bunting MD Primary Provider: Gershon Crane, MD Requesting Provider: n/a Chief Complaint: Patient here for routine f/u of cirrhosis. No  current GI symptoms History of Present Illness:     very pleasant 55 year old man whom I last saw about 2 months ago. Since then he is up 7 pounds since last visit.  he feels well overall, no overt GI bleeding, no encephalopathic episodes.           Current Medications (verified): 1)  Allopurinol 100 Mg Tabs (Allopurinol) .... Once Daily 2)  Epipen 0.3 Mg/0.59ml (1:1000)  Devi (Epinephrine Hcl (Anaphylaxis)) .... As Needed 3)  Cpap .... At Bedtime 4)  Triamcinolone Acetonide 0.1 % Crea (Triamcinolone Acetonide) .... Apply Two Times A Day As Needed 5)  Spironolactone 50 Mg Tabs (Spironolactone) .Marland Kitchen.. 1 Two Times A Day 6)  Klor-Con 10 10 Meq Cr-Tabs (Potassium Chloride) .Marland Kitchen.. 1 Once Daily 7)   Furosemide 40 Mg Tabs (Furosemide) .Marland Kitchen.. 1 Once Daily 8)  Nadolol 20 Mg Tabs (Nadolol) .Marland Kitchen.. 1 Once Daily 9)  Milk Thistle 175 Mg Caps (Milk Thistle) .Marland Kitchen.. 1 Two Times A Day 10)  Novolog Mix 70/30 Flexpen 70-30 % Susp (Insulin Aspart Prot & Aspart) .... Use 20 Units With Breakfast,  20 At Lunch,  and 40 With Supper 11)  Lantus Solostar 100 Unit/ml Soln (Insulin Glargine) .... Use 60 Units At Bedtime  Allergies (verified): 1)  ! * Bee Stings  Vital Signs:  Patient profile:   55 year old male Height:      68.5 inches Weight:      331.13 pounds BMI:     49.80 BSA:     2.55 Pulse rate:   84 / minute Pulse rhythm:   regular BP sitting:   106 / 70  (left arm) Cuff size:   large  Vitals Entered By: Lamona Curl CMA Duncan Dull) (March 04, 2010 8:31 AM)  Physical Exam  Additional Exam:  Constitutional: Morbidly obese, otherwise generally well appearing Psychiatric: alert and oriented times 3 Abdomen: soft, non-tender, non-distended, normal bowel sounds    Impression & Recommendations:  Problem # 1:  cirrhosis he is doing well on current diuretic regimen in terms of fluid status. His heart rate was in the 80s and his blood pressure systolic was about 105. I think we can increase his nadolol to decrease the chance of first-time variceal hemorrhage. He'll return for blood pressure and heart rate check in 2 weeks' time. He'll return to see me otherwise in about 4 months and have labs drawn shortly before  then.  Patient Instructions: 1)  Will double your nadalol to 40mg  a day.  2)  You will need to return to for BP and HR check in 2 weeks. 3)  Continue to try to lose weight. 4)  Avoid NSAIDs. 5)  Return to see Dr. Christella Hartigan in 4 months (will have cbc, inr, cmet a couple days prior). 6)  The medication list was reviewed and reconciled.  All changed / newly prescribed medications were explained.  A complete medication list was provided to the patient / caregiver. Prescriptions: NADOLOL 20  MG TABS (NADOLOL) take 2 pills, once daily  #60 x 6   Entered and Authorized by:   Rachael Fee MD   Signed by:   Rachael Fee MD on 03/04/2010   Method used:   Electronically to        [facility]  [address] [phone]* (retail)       [address]       [place], [place]  [address]       Ph: [phone]       Fax: [phone]   RxID:   [id number]

## 2010-06-22 ENCOUNTER — Ambulatory Visit: Admit: 2010-06-22 | Payer: Self-pay | Admitting: Gastroenterology

## 2010-06-22 ENCOUNTER — Other Ambulatory Visit: Payer: Self-pay

## 2010-06-22 ENCOUNTER — Telehealth (INDEPENDENT_AMBULATORY_CARE_PROVIDER_SITE_OTHER): Payer: Self-pay | Admitting: *Deleted

## 2010-06-29 NOTE — Progress Notes (Signed)
Summary: lab reminder   Phone Note Outgoing Call   Call placed by: Chales Abrahams CMA Duncan Dull),  June 22, 2010 3:06 PM Summary of Call: called and reminded pt to have labs and schedule f/u shortly after.  Pt will call back to schedule f/u Initial call taken by: Chales Abrahams CMA Duncan Dull),  June 22, 2010 3:06 PM

## 2010-07-07 ENCOUNTER — Encounter: Payer: Self-pay | Admitting: Internal Medicine

## 2010-07-07 ENCOUNTER — Ambulatory Visit (INDEPENDENT_AMBULATORY_CARE_PROVIDER_SITE_OTHER): Payer: PRIVATE HEALTH INSURANCE | Admitting: Internal Medicine

## 2010-07-07 VITALS — BP 132/80 | HR 84 | Temp 98.1°F | Ht 69.5 in | Wt 326.0 lb

## 2010-07-07 DIAGNOSIS — M549 Dorsalgia, unspecified: Secondary | ICD-10-CM

## 2010-07-07 DIAGNOSIS — E119 Type 2 diabetes mellitus without complications: Secondary | ICD-10-CM

## 2010-07-07 LAB — POCT URINALYSIS DIPSTICK
Blood, UA: NEGATIVE
Ketones, UA: NEGATIVE
Nitrite, UA: NEGATIVE
Protein, UA: NEGATIVE
pH, UA: 5.5

## 2010-07-07 MED ORDER — CIPROFLOXACIN HCL 500 MG PO TABS: 500.0000 mg | ORAL_TABLET | Freq: Two times a day (BID) | ORAL | Status: AC

## 2010-07-08 ENCOUNTER — Encounter: Payer: Self-pay | Admitting: Internal Medicine

## 2010-07-08 DIAGNOSIS — M549 Dorsalgia, unspecified: Secondary | ICD-10-CM | POA: Insufficient documentation

## 2010-07-08 IMAGING — CR DG CHEST 2V
2 series · 2 of 2 positions shown · non-contrast
Comparison: 10/23/2008

CLINICAL DATA: History of shortness of breath.  Hypertension.
Tobacco smoking.  Swelling.

CHEST - 2 VIEW

[w chest pa *]
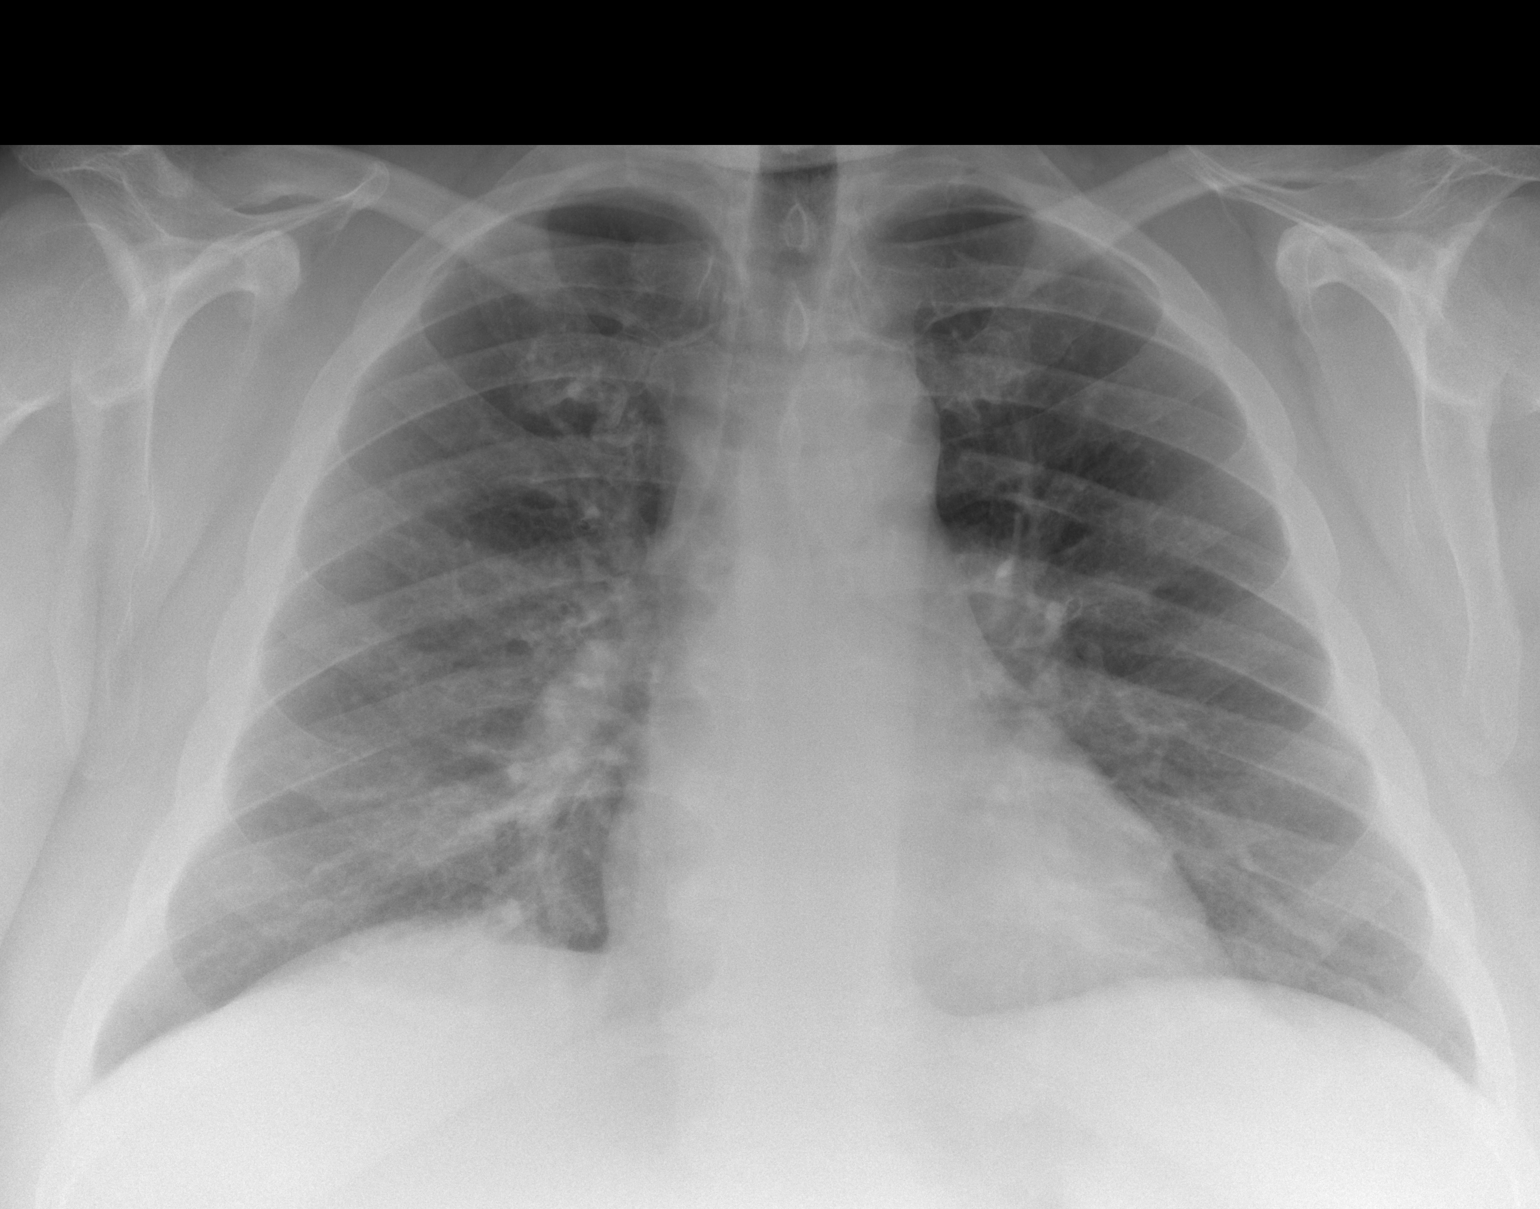

[w chest lat *]
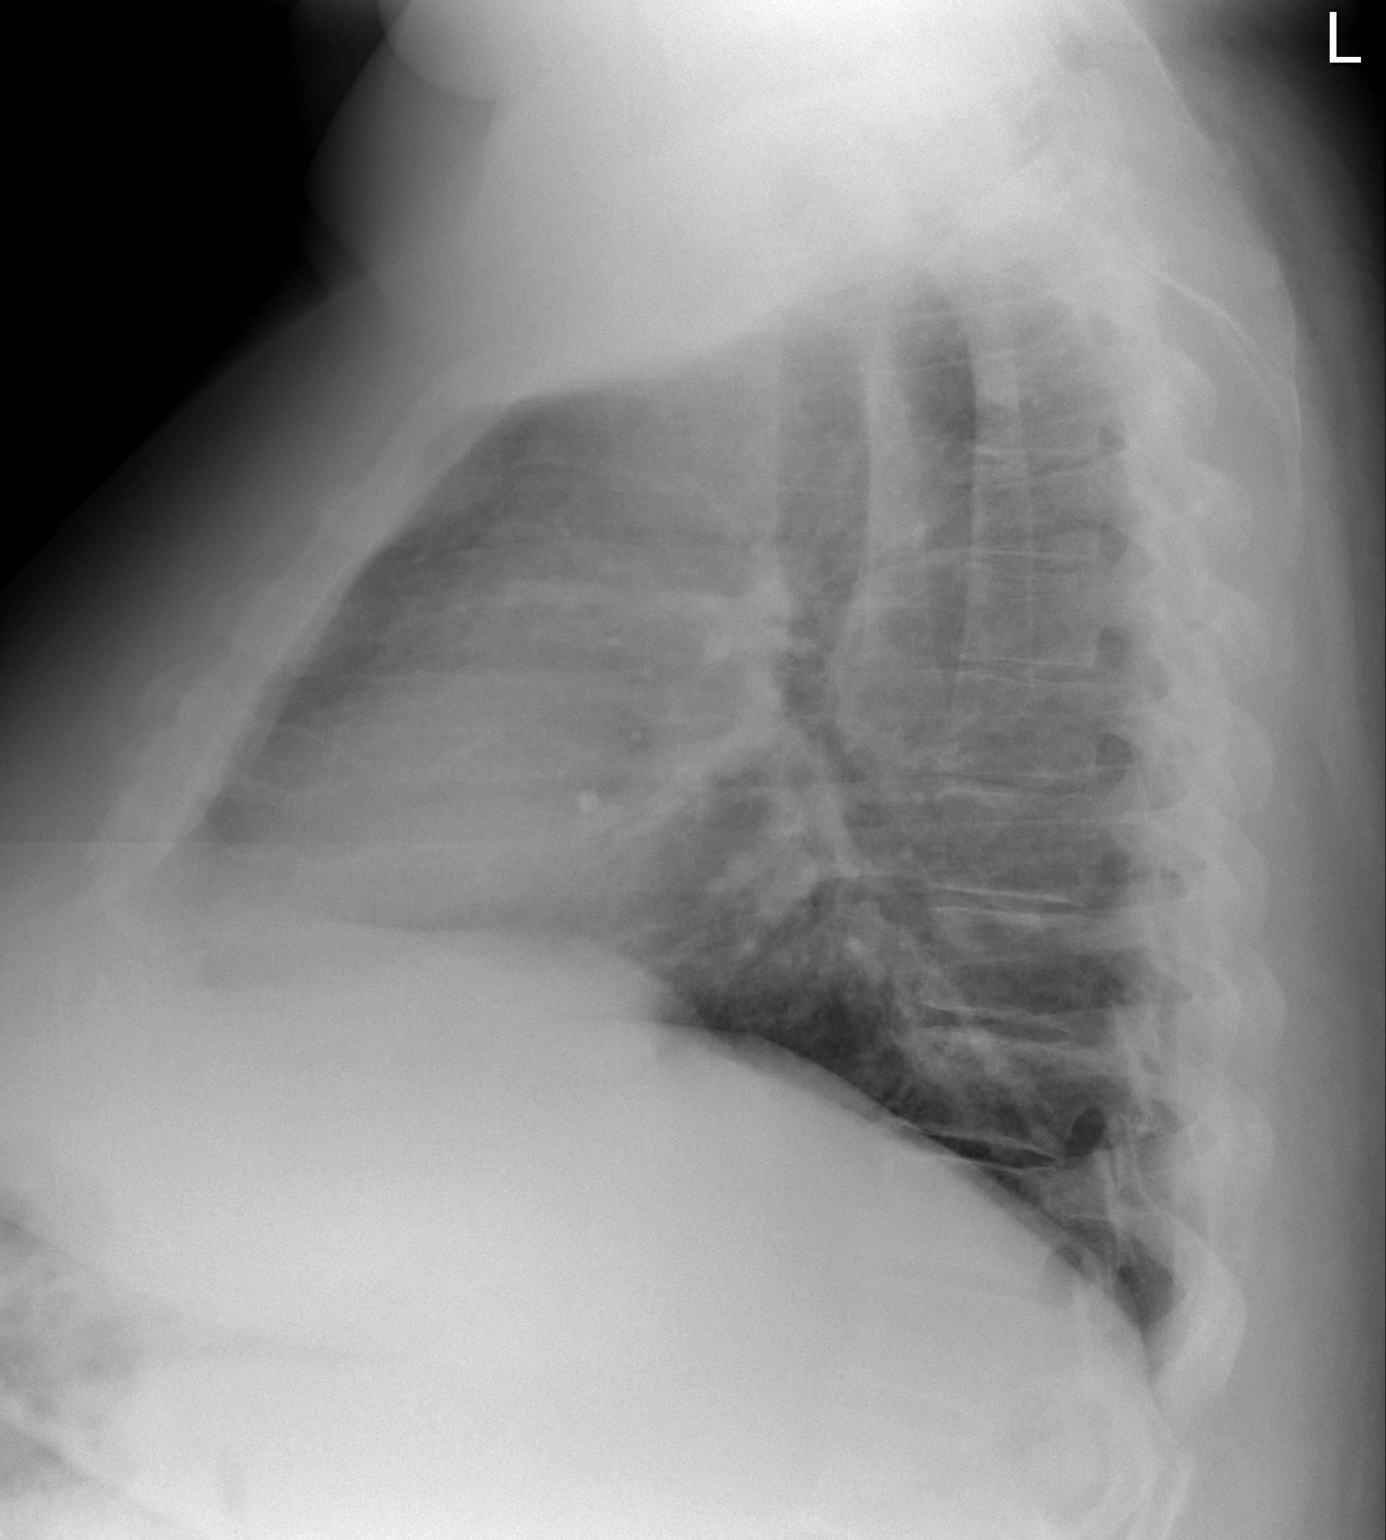

[2 of 2 positions shown; findings below may reference images not displayed]

FINDINGS: A small respiratory result was obtained than on the
previous study.  There is slight elevation of right hemidiaphragm
with minimal right basilar atelectasis and hazy infiltrate.  No
consolidation or pleural effusion is seen. The cardiac silhouette
is normal size and shape. Osteophytes are present the spine.
IMPRESSION: Minimal right basilar atelectasis and hazy infiltrate are seen. No
consolidation or pleural effusion is evident.

## 2010-07-08 NOTE — Assessment & Plan Note (Signed)
Clinically not c/w MSK etiology. Likely GU with possible infectious etiology. Cannot exclude neprholithiasis at this point. UA obtained and reviewed. Recommend abx tx with cipro. Followup closely if no improvement or worsening.

## 2010-07-08 NOTE — Progress Notes (Signed)
  Subjective:    Patient ID: Bobby Mcfarland, male    DOB: 04-23-1956, 55 y.o.   MRN: [id number]  HPI Pt presents to clinic for evaluation of possible urinary tract infection. Notes 3d h/o right lbp described as a dull ache. Not reproducible or positional and no injury/trauma. Also notes malodorous urine which is slightly darker than usual. Denies f/c, n/v or hematuria. Does have h/o nephrolithiasis with no recent attack. UA obtained and reviewed with pt. Demonstrates +2 glucose with h/o DM. States blood sugar control had recently worsened to peak of ~300 but most recently is improving with last fsbs's in the 160 range. No hypoglycemia.   Reviewed PMH, medications and allergies.    Review of Systems  Constitutional: Negative for fever, chills and diaphoresis.  Gastrointestinal: Negative for nausea, vomiting, abdominal pain, diarrhea and constipation.  Genitourinary: Negative for dysuria, urgency, hematuria, flank pain, decreased urine volume and difficulty urinating.  Musculoskeletal: Positive for back pain. Negative for gait problem.  Skin: Negative for color change and pallor.        Objective:   Physical Exam  Constitutional: He appears well-developed and well-nourished. No distress.  HENT:  Head: Normocephalic and atraumatic.  Right Ear: External ear normal.  Eyes: Conjunctivae are normal. No scleral icterus.  Abdominal: Soft. He exhibits no distension. There is no tenderness. There is no rebound and no guarding.  Musculoskeletal:       No CVA tenderness  Skin: Skin is warm. No bruising noted. He is not diaphoretic. No cyanosis. No pallor. Nails show no clubbing.          Assessment & Plan:

## 2010-07-08 NOTE — Assessment & Plan Note (Signed)
Glucosuria noted however pt states recent hyperglycemia now improving. Recommend close monitoring of fsbs especially during time of illness and notify clinic of further hyperglycemic trends. Otherwise f/u with PMD as scheduled for re-evaluation

## 2010-07-09 IMAGING — US US PARACENTESIS
1 series · 9 of 9 positions shown · non-contrast
Comparison: none

CLINICAL DATA: Liver cirrhosis with new ascites.  Request has been
made for paracentesis.

ULTRASOUND-GUIDED PARACENTESIS
TECHNIQUE: An ultrasound-guided paracentesis was thoroughly
discussed with the patient and questions answered.  The benefits,
risks, alternatives and complications were also discussed.  The
patient understands and wishes to proceed with the procedure.  A
verbal as well as written consent was obtained. Ultrasound was
performed to localize and mark an adequate pocket of fluid in the
right upper quadrant of the abdomen.  The area was then prepped and
draped in the normal sterile fashion.  1% Lidocaine was used for
local anesthesia.  Under ultrasound guidance a 19-gauge Yueh
catheter was introduced yielding approximately 6 liters of cloudy
serous fluid.  The patient tolerated the procedure well and there
were no immediate complications. Given that this was the patient's
first time with paracentesis 6 liters maximum was removed.  The
patient  may have paracentesis repeated after 24 hours.  Fluid was
sent to the laboratory to be held in case the ordering physician
would like analysis of the fluid.

[Series 1: us paracentesis · 0.42mm/px · 9 of 9 slices shown]
[im 1/9]
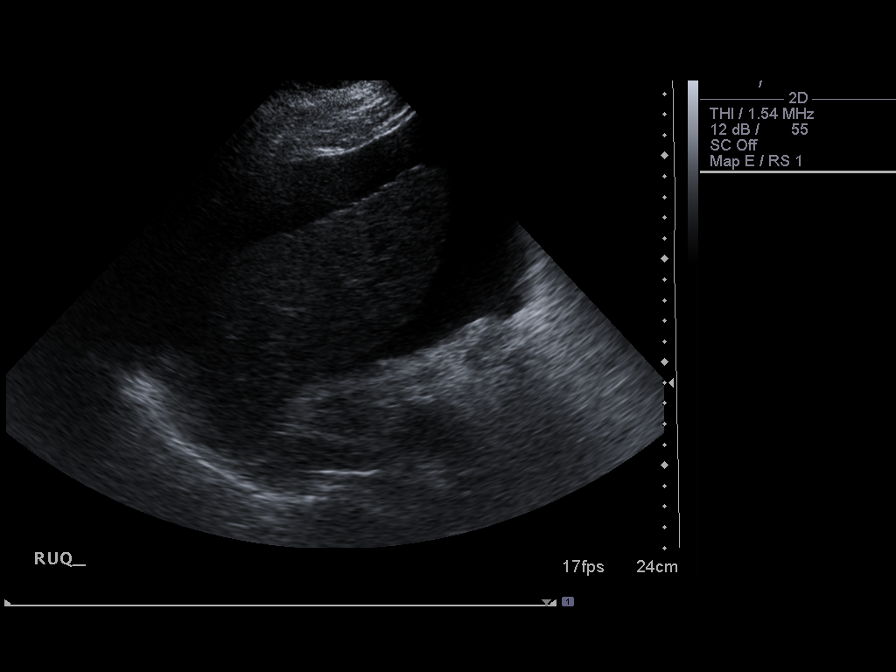
[im 2/9]
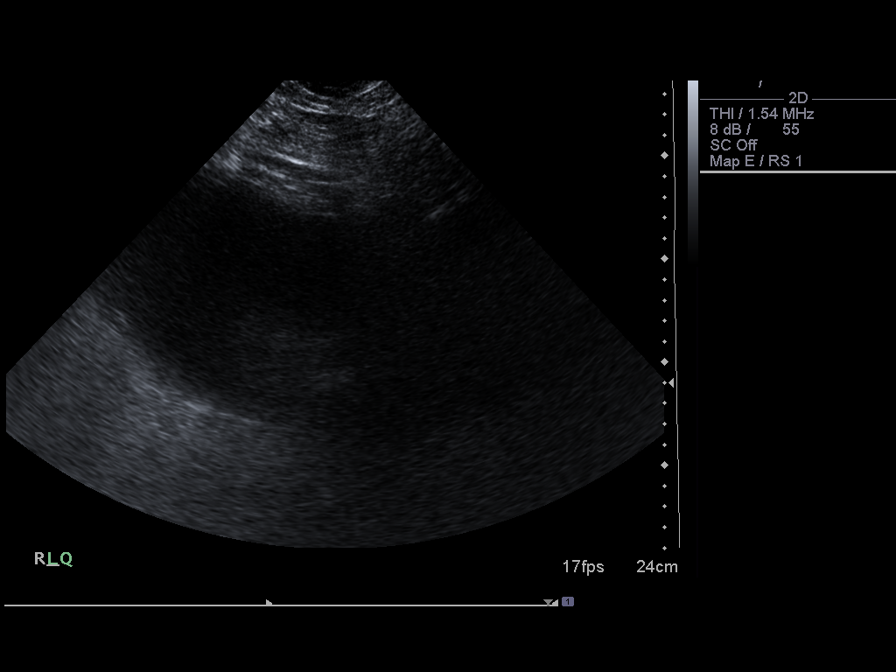
[im 3/9]
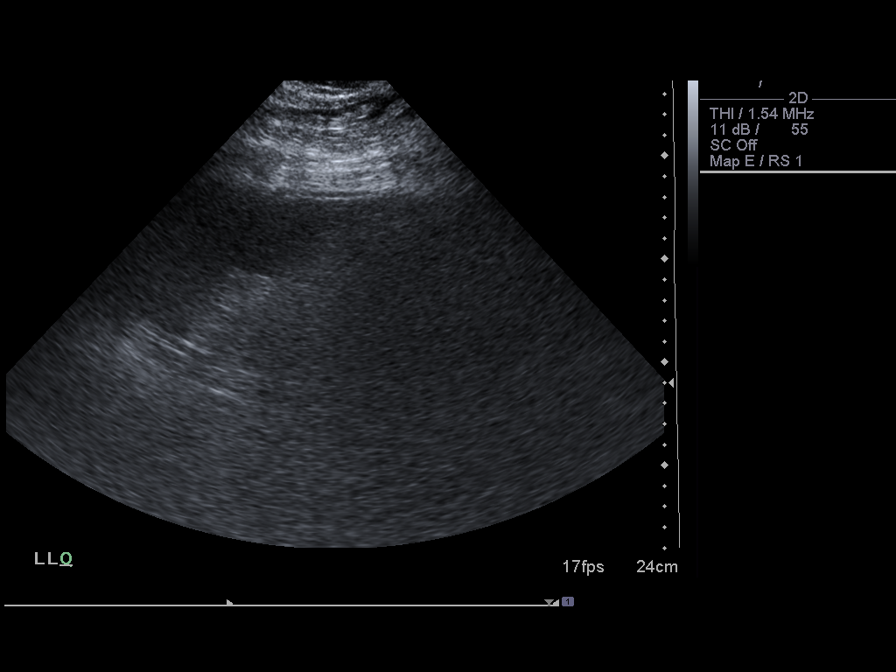
[im 4/9]
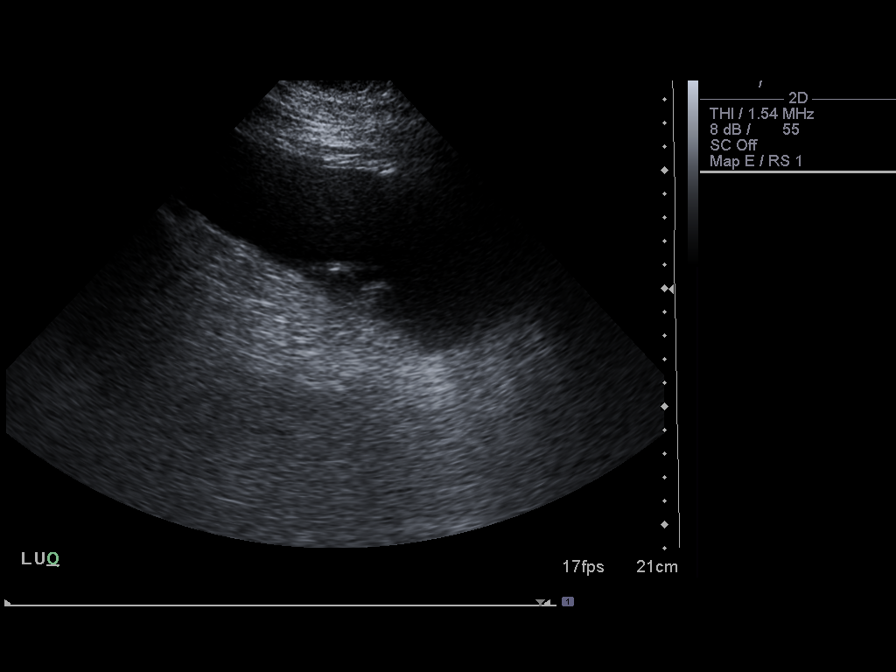
[im 5/9]
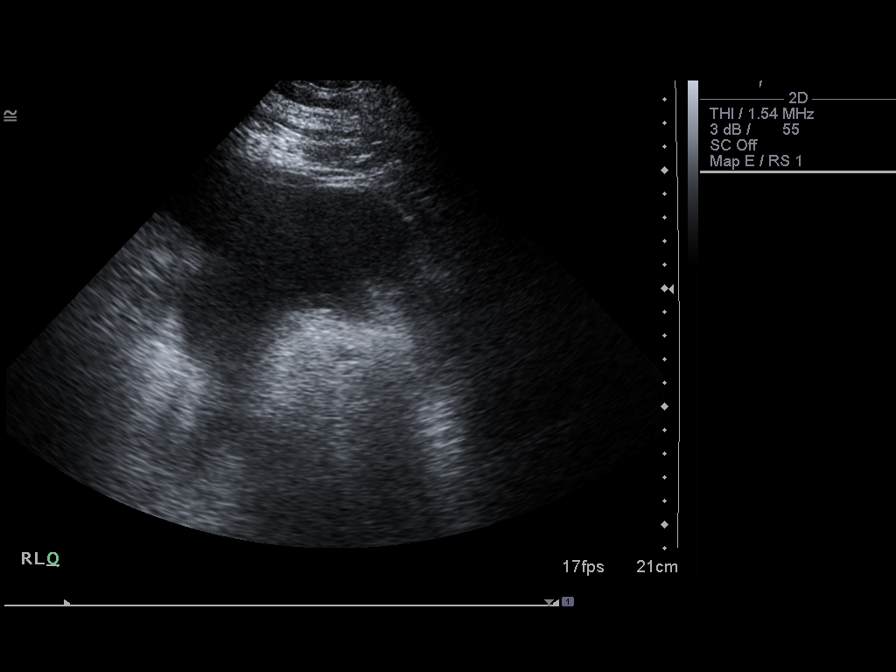
[im 6/9]
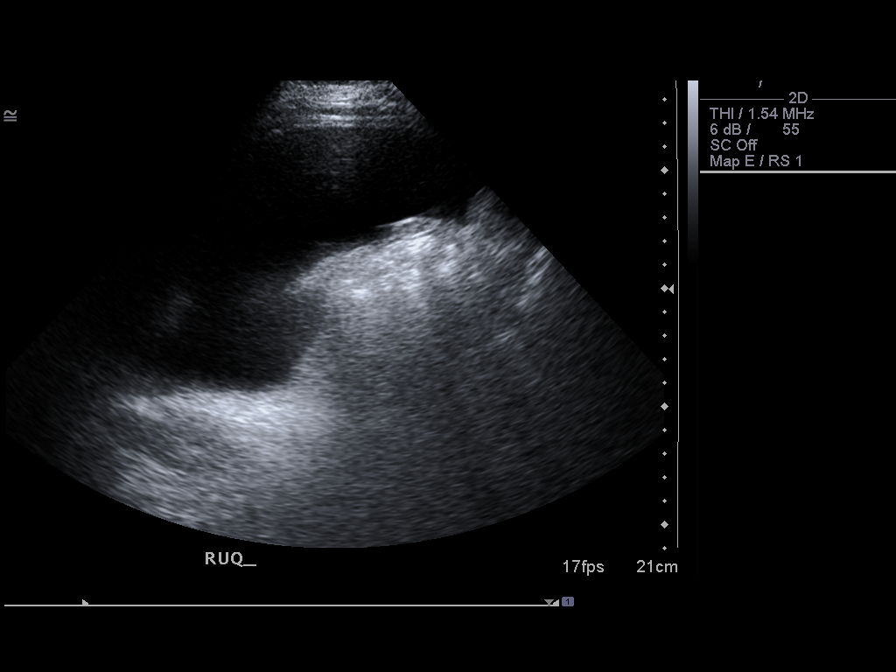
[im 7/9]
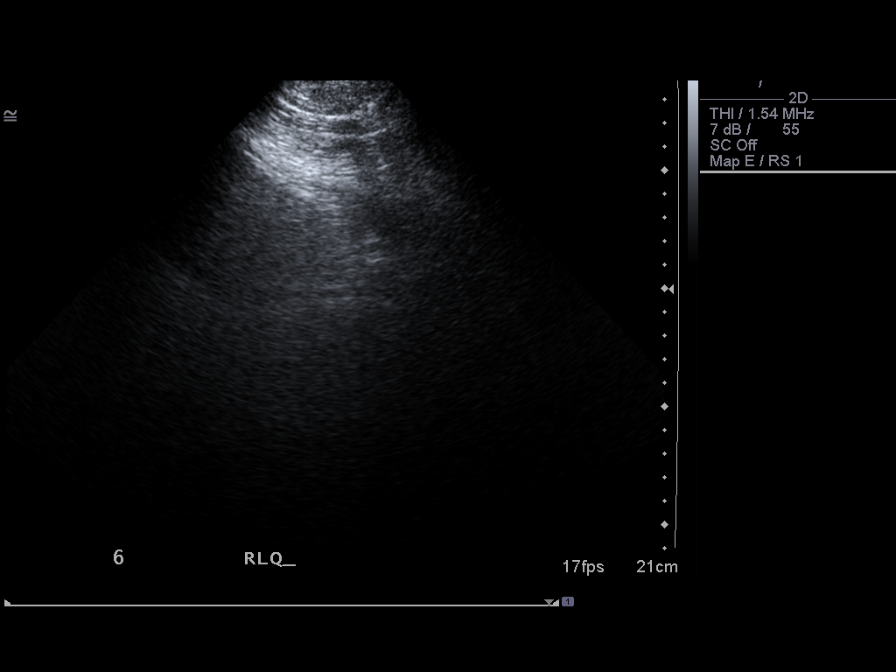
[im 8/9]
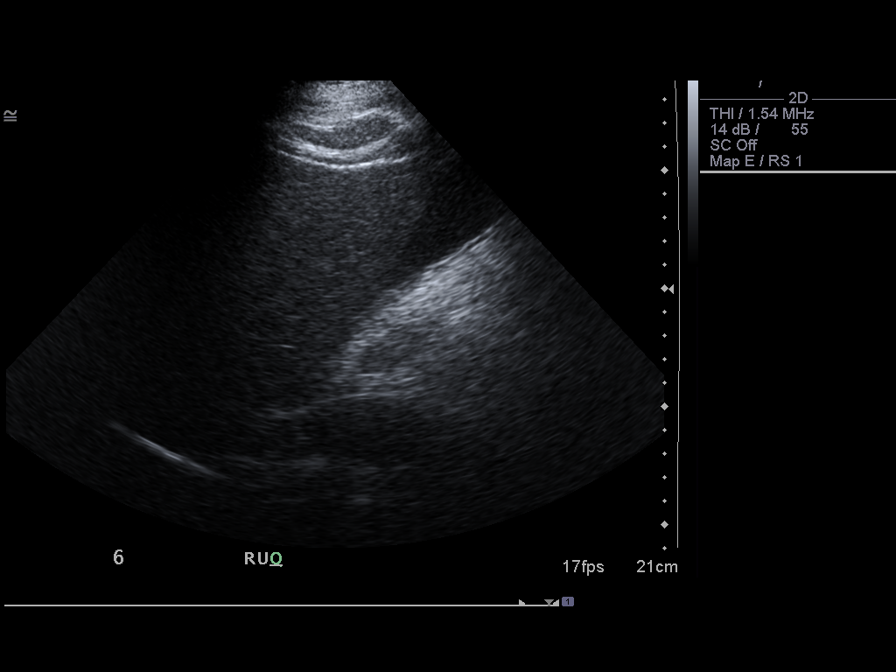
[im 9/9]
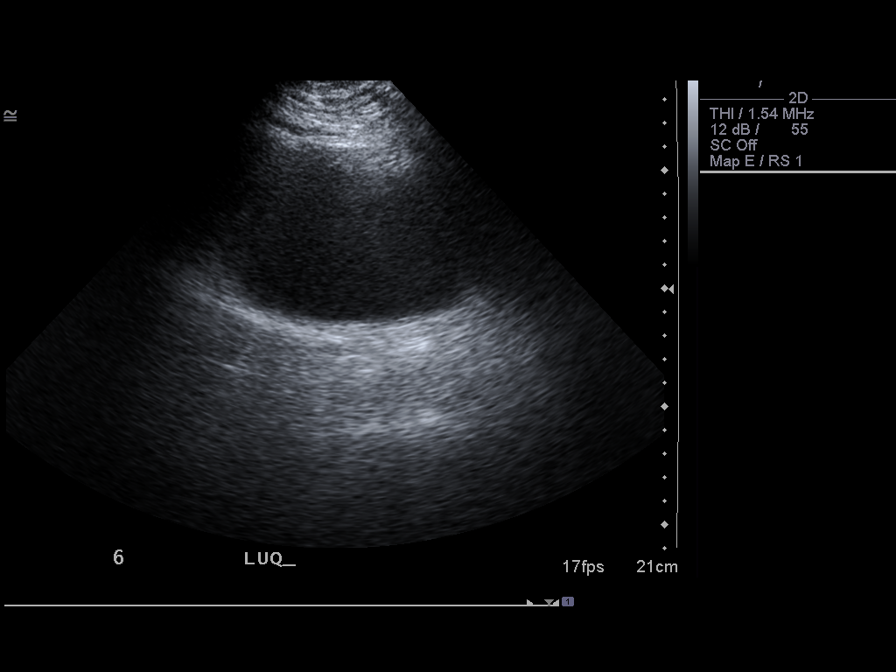

[9 of 9 positions shown; findings below may reference images not displayed]

IMPRESSION: Successful ultrasound-guided paracentesis yielding 6 liters of
cloudy serous fluid.

Read by: Ratadiya, [email or web address].-[email or web address]

## 2010-07-09 IMAGING — US US ABDOMEN COMPLETE
1 series · 14 of 25 positions shown · non-contrast
Comparison: CT abdomen 08/12/2009.

CLINICAL DATA: 53-year-old male with abdominal pain, distention,
cirrhosis.

COMPLETE ABDOMINAL ULTRASOUND

[Series 1: us abdomen complete · 0.33mm/px · 14 of 71 slices shown]
[im 1/71]
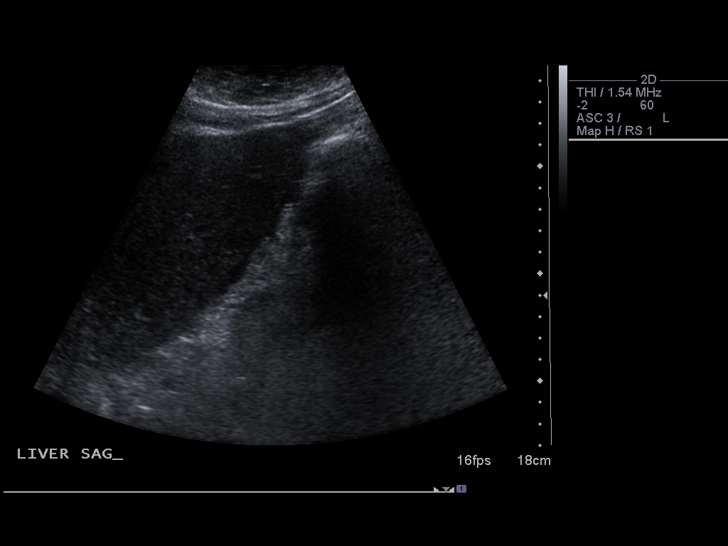
[im 6/71]
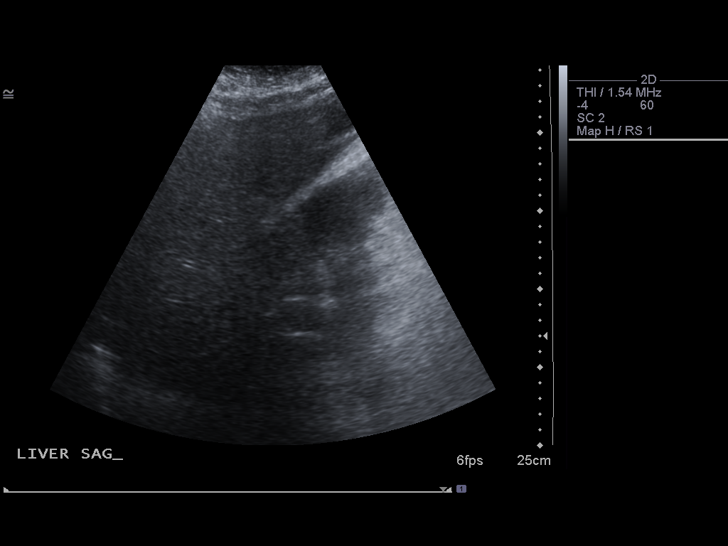
[im 12/71]
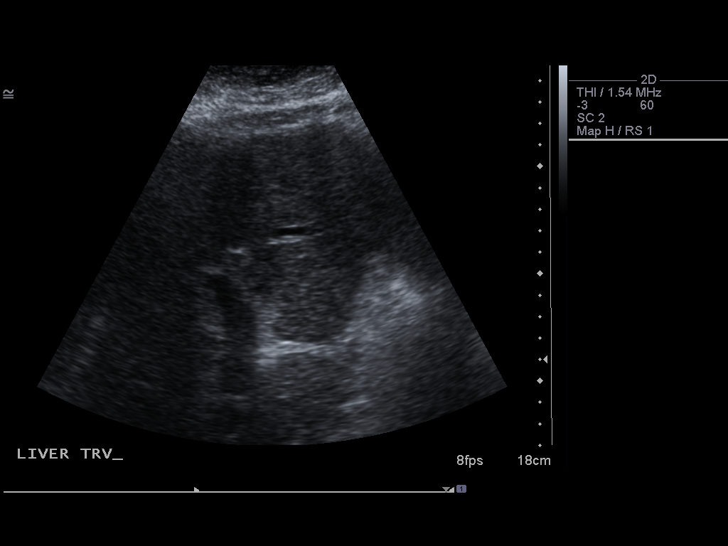
[im 18/71]
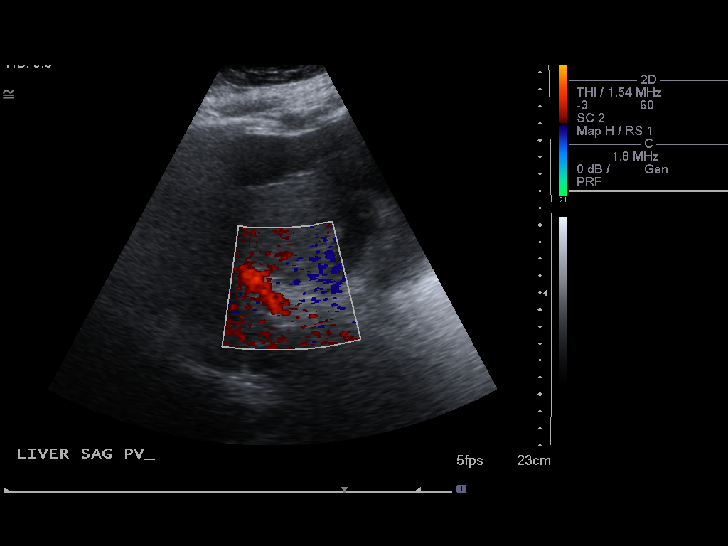
[im 24/71]
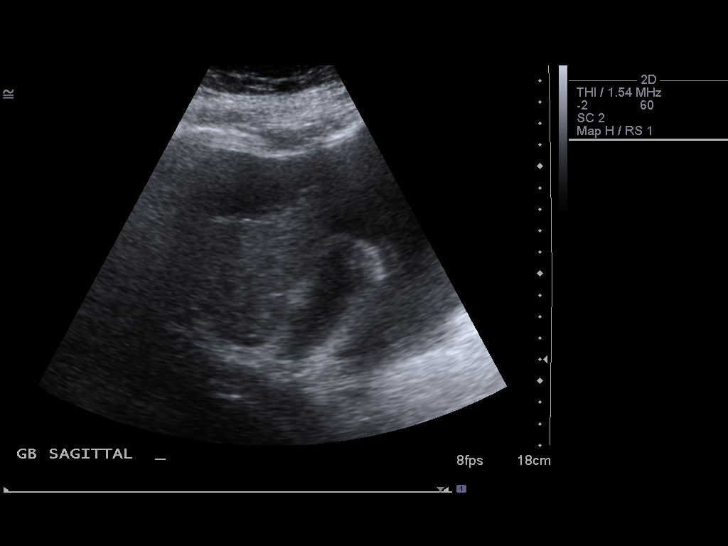
[im 27/71]
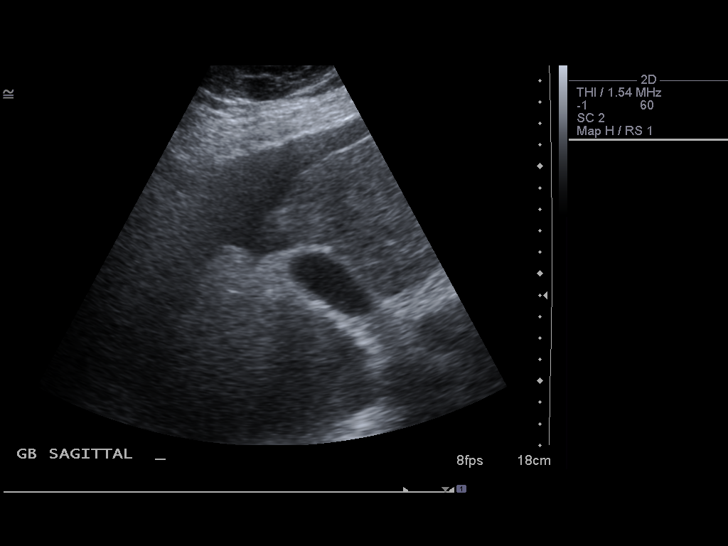
[im 33/71]
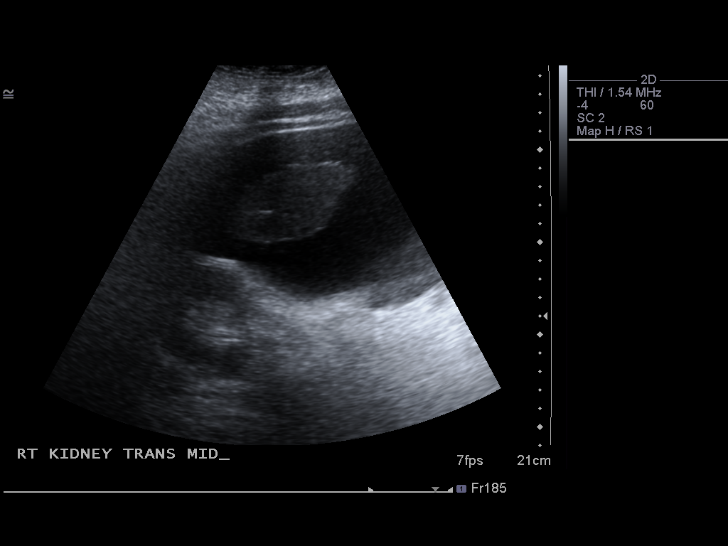
[im 38/71]
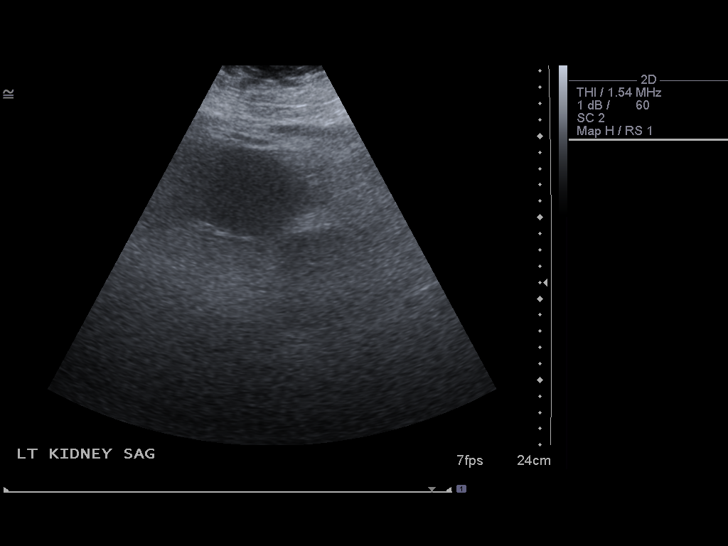
[im 44/71]
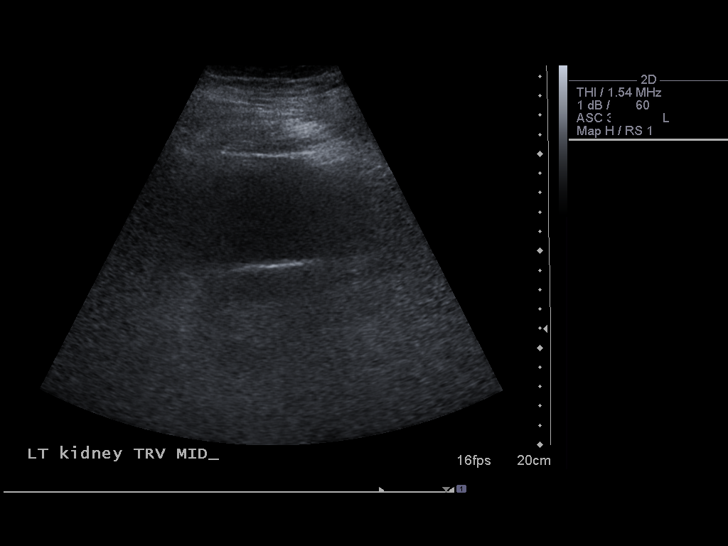
[im 47/71]
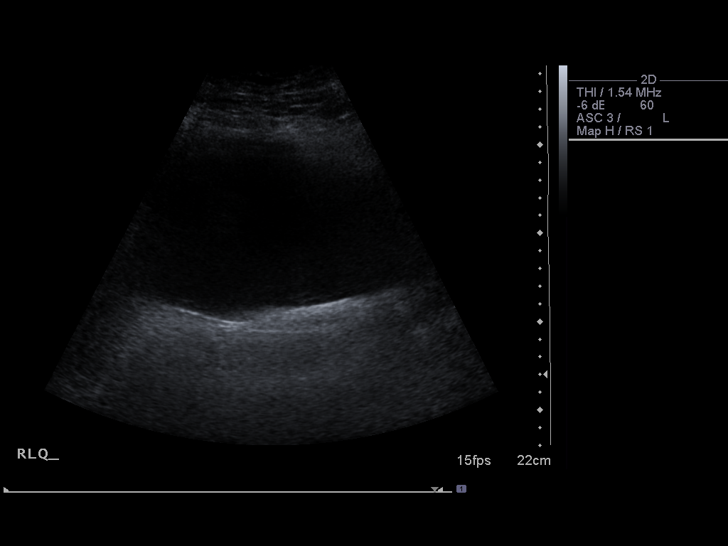
[im 53/71]
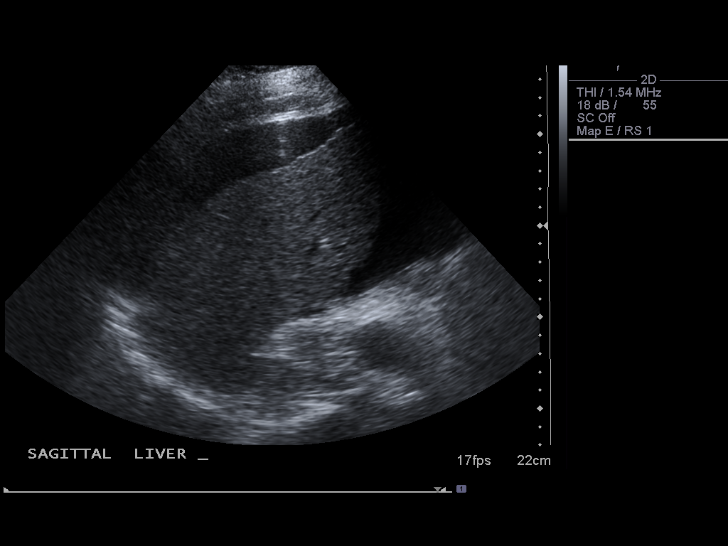
[im 59/71]
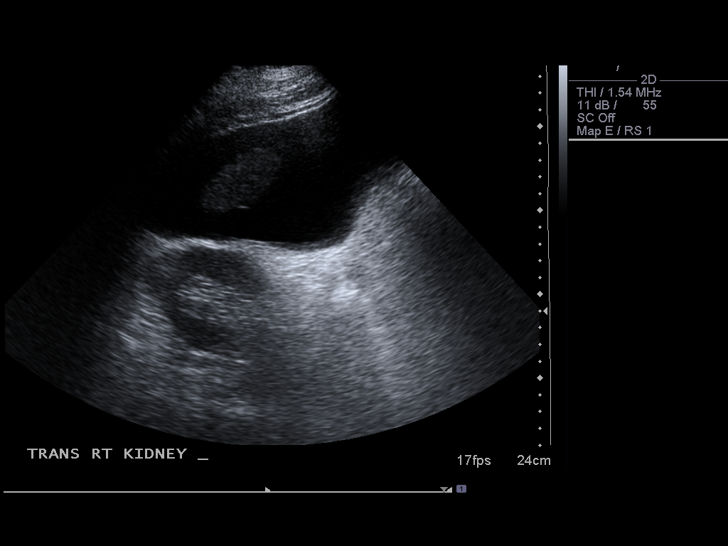
[im 65/71]
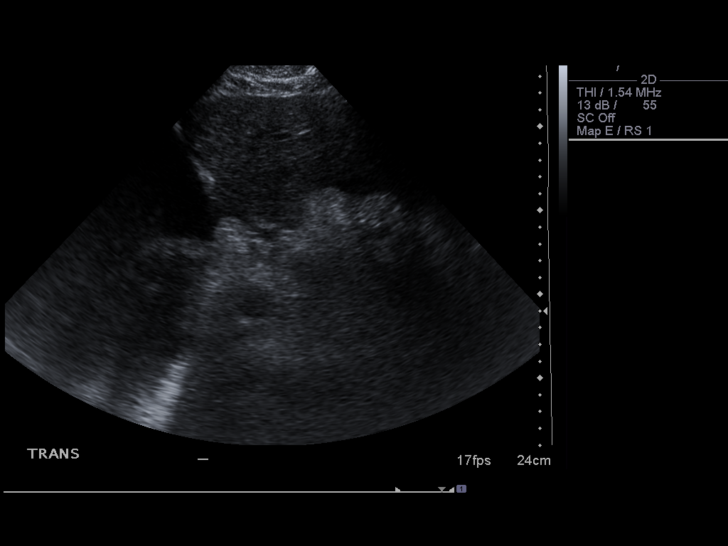
[im 71/71]
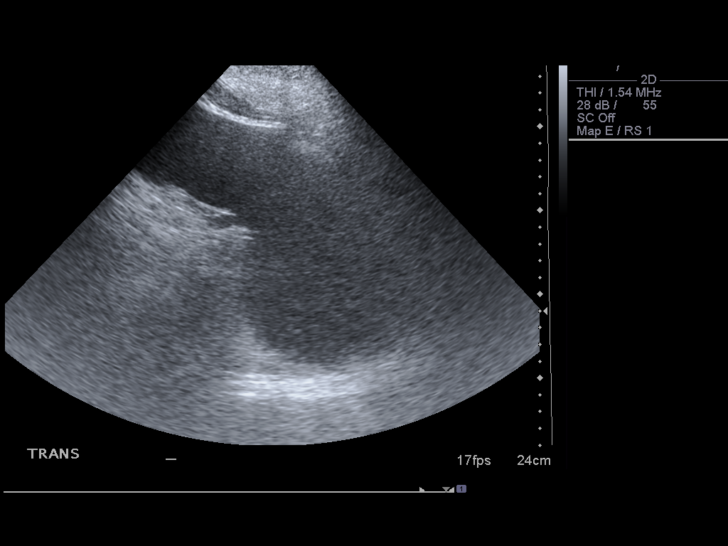

[14 of 25 positions shown; findings below may reference images not displayed]

FINDINGS: Gallbladder:  No gallstones, gallbladder wall thickening, or
pericholecystic fluid.

Common bile duct:  Normal measuring 3 mm in diameter.

Liver:  Irregular and nodular with coarse echotexture compatible
with cirrhosis.  No focal liver lesion.

IVC:  Appears normal.

Pancreas:  Cannot be visualized due to overlying bowel gas.

Spleen:  Enlarged with estimated volume of 7820 ml (normal range 83
- 412 ml).

Right Kidney:  Normal measuring 14.5 cm in length.

Left Kidney:  Normal measuring 15.6 cm in length.

Abdominal aorta:  Incompletely visualized due to overlying bowel
gas, maximum visualized diameter 2.7 cm.

Other findings:  At least moderate volume of ascites.
IMPRESSION: Cirrhotic liver with splenomegaly and at least moderate volume of
ascites.

## 2010-07-29 ENCOUNTER — Encounter: Payer: Self-pay | Admitting: Gastroenterology

## 2010-08-02 ENCOUNTER — Other Ambulatory Visit: Payer: Self-pay | Admitting: Family Medicine

## 2010-08-02 NOTE — Letter (Signed)
Summary: Office Visit Letter  [facility]  [address] [place], [place] [address]   Phone: [phone]  Fax: [phone]      July 29, 2010 MRN: [id number]   Bobby Mcfarland [address] [place], [place]  [address]   Dear Mr. Warbington,   According to our records, it is time for you to schedule a follow-up office visit with [place].   At your convenience, please call [phone] (option #2)to schedule an office visit. If you have any questions, concerns, or feel that this letter is in error, we would appreciate your call.   Sincerely,  Rachael Fee, M.D.  [facility] [phone]

## 2010-08-03 ENCOUNTER — Telehealth: Payer: Self-pay | Admitting: Gastroenterology

## 2010-08-08 ENCOUNTER — Encounter: Payer: Self-pay | Admitting: Family Medicine

## 2010-08-09 LAB — GLUCOSE, CAPILLARY

## 2010-08-09 NOTE — Progress Notes (Signed)
Summary: Questions   Phone Note Call from Patient Call back at Home Phone [phone]   Caller: Patient Call For: Dr. Christella Hartigan Reason for Call: Talk to Nurse Summary of Call: Patient called to schedule his office visit and says that he needs to speak with the nurse because he always had to get labs before Dr. Christella Hartigan sees him and wants schedule these Initial call taken by: [place] Johnson,  August 03, 2010 9:18 AM  Follow-up for Phone Call        pt advised labs in [facility] to have them done a day or two before,   Follow-up by: Chales Abrahams CMA Duncan Dull),  August 03, 2010 3:09 PM

## 2010-08-10 ENCOUNTER — Other Ambulatory Visit: Payer: PRIVATE HEALTH INSURANCE

## 2010-08-10 DIAGNOSIS — Z0000 Encounter for general adult medical examination without abnormal findings: Secondary | ICD-10-CM

## 2010-08-10 LAB — COMPREHENSIVE METABOLIC PANEL
ALT: 29 U/L (ref 0–53)
AST: 45 U/L — ABNORMAL HIGH (ref 0–37)
AST: 47 U/L — ABNORMAL HIGH (ref 0–37)
Albumin: 3.1 g/dL — ABNORMAL LOW (ref 3.5–5.2)
Alkaline Phosphatase: 60 U/L (ref 39–117)
Alkaline Phosphatase: 62 U/L (ref 39–117)
BUN: 6 mg/dL (ref 6–23)
CO2: 28 mEq/L (ref 19–32)
Calcium: 8.7 mg/dL (ref 8.4–10.5)
Chloride: 103 mEq/L (ref 96–112)
GFR calc Af Amer: >60 mL/min (ref >60)
Glucose, Bld: 100 mg/dL — ABNORMAL HIGH (ref 70–99)
Glucose, Bld: 94 mg/dL (ref 70–99)
Potassium: 3.5 mEq/L (ref 3.5–5.1)
Potassium: 3.6 mEq/L (ref 3.5–5.1)
Sodium: 140 mEq/L (ref 135–145)
Total Bilirubin: 1 mg/dL (ref 0.3–1.2)
Total Protein: 6.4 g/dL (ref 6.0–8.3)
Total Protein: 6.5 g/dL (ref 6.0–8.3)
Total Protein: 7.5 g/dL (ref 6.0–8.3)

## 2010-08-10 LAB — FERRITIN: Ferritin: 24 ng/mL (ref 22–322)

## 2010-08-10 LAB — ALBUMIN, FLUID (OTHER): Albumin, Fluid: 1.1 g/dL

## 2010-08-10 LAB — AFB CULTURE WITH SMEAR (NOT AT ARMC): Acid Fast Smear: NONE SEEN

## 2010-08-10 LAB — IRON AND TIBC
Iron: 134 ug/dL (ref 42–135)
UIBC: 251 ug/dL

## 2010-08-10 LAB — BASIC METABOLIC PANEL
CO2: 26 mEq/L (ref 19–32)
Chloride: 99 mEq/L (ref 96–112)
Potassium: 4.3 mEq/L (ref 3.5–5.1)

## 2010-08-10 LAB — HEPATITIS PANEL, ACUTE: HCV Ab: NEGATIVE

## 2010-08-10 LAB — POCT CARDIAC MARKERS
CKMB, poc: 1.2 ng/mL (ref 1.0–8.0)
Myoglobin, poc: 142 ng/mL (ref 12–200)

## 2010-08-10 LAB — CBC
HCT: 32 % — ABNORMAL LOW (ref 39.0–52.0)
HCT: 37.7 % — ABNORMAL LOW (ref 39.0–52.0)
Hemoglobin: 10.9 g/dL — ABNORMAL LOW (ref 13.0–17.0)
Hemoglobin: 12.4 g/dL — ABNORMAL LOW (ref 13.0–17.0)
MCHC: 33 g/dL (ref 30.0–36.0)
MCHC: 34.1 g/dL (ref 30.0–36.0)
RDW: 16.2 % — ABNORMAL HIGH (ref 11.5–15.5)
RDW: 16.5 % — ABNORMAL HIGH (ref 11.5–15.5)

## 2010-08-10 LAB — CBC WITH DIFFERENTIAL/PLATELET
Basophils Relative: 0.4 % (ref 0.0–3.0)
Eosinophils Relative: 3.7 % (ref 0.0–5.0)
HCT: 45 % (ref 39.0–52.0)
Hemoglobin: 15.6 g/dL (ref 13.0–17.0)
Lymphs Abs: 1 10*3/uL (ref 0.7–4.0)
MCV: 98.1 fl (ref 78.0–100.0)
Monocytes Absolute: 0.8 10*3/uL (ref 0.1–1.0)
Neutro Abs: 5.2 10*3/uL (ref 1.4–7.7)
RBC: 4.59 Mil/uL (ref 4.22–5.81)
WBC: 7.3 10*3/uL (ref 4.5–10.5)

## 2010-08-10 LAB — GLUCOSE, CAPILLARY
Glucose-Capillary: 148 mg/dL — ABNORMAL HIGH (ref 70–99)
Glucose-Capillary: 151 mg/dL — ABNORMAL HIGH (ref 70–99)
Glucose-Capillary: 154 mg/dL — ABNORMAL HIGH (ref 70–99)
Glucose-Capillary: 155 mg/dL — ABNORMAL HIGH (ref 70–99)

## 2010-08-10 LAB — GRAM STAIN

## 2010-08-10 LAB — VITAMIN B12: Vitamin B-12: 390 pg/mL (ref 211–911)

## 2010-08-10 LAB — HEPATIC FUNCTION PANEL
ALT: 51 U/L (ref 0–53)
Albumin: 3.5 g/dL (ref 3.5–5.2)
Bilirubin, Direct: 0.3 mg/dL (ref 0.0–0.3)
Total Protein: 7.8 g/dL (ref 6.0–8.3)

## 2010-08-10 LAB — POCT URINALYSIS DIPSTICK
Bilirubin, UA: NEGATIVE
Blood, UA: NEGATIVE
Leukocytes, UA: NEGATIVE
Nitrite, UA: NEGATIVE

## 2010-08-10 LAB — DIFFERENTIAL
Basophils Absolute: 0 10*3/uL (ref 0.0–0.1)
Eosinophils Relative: 5 % (ref 0–5)
Lymphocytes Relative: 20 % (ref 12–46)
Lymphs Abs: 0.6 10*3/uL — ABNORMAL LOW (ref 0.7–4.0)
Monocytes Absolute: 0.5 10*3/uL (ref 0.1–1.0)
Monocytes Relative: 14 % — ABNORMAL HIGH (ref 3–12)
Monocytes Relative: 17 % — ABNORMAL HIGH (ref 3–12)
Neutro Abs: 2.5 10*3/uL (ref 1.7–7.7)
Neutrophils Relative %: 66 % (ref 43–77)

## 2010-08-10 LAB — HEMOGLOBIN A1C: Hgb A1c MFr Bld: 12.2 % — ABNORMAL HIGH (ref 4.6–6.5)

## 2010-08-10 LAB — ALPHA-1-ANTITRYPSIN: A-1 Antitrypsin, Ser: 184 mg/dL (ref 83–200)

## 2010-08-10 LAB — LIPID PANEL
Cholesterol: 157 mg/dL (ref 0–200)
Cholesterol: 80 mg/dL (ref 0–200)
LDL Cholesterol: 36 mg/dL (ref 0–99)
Total CHOL/HDL Ratio: 4
Triglycerides: 88 mg/dL (ref 0.0–149.0)
VLDL: 13 mg/dL (ref 0–40)

## 2010-08-10 LAB — PROTEIN, BODY FLUID: Total protein, fluid: <3 g/dL

## 2010-08-10 LAB — PROTIME-INR
INR: 1.21 (ref 0.00–1.49)
INR: 1.37 (ref 0.00–1.49)
INR: 1.4 (ref 0.00–1.49)
Prothrombin Time: 16.8 seconds — ABNORMAL HIGH (ref 11.6–15.2)

## 2010-08-10 LAB — TSH
TSH: 2.25 u[IU]/mL (ref 0.35–5.50)
TSH: 3.907 u[IU]/mL (ref 0.350–4.500)

## 2010-08-10 LAB — BILIRUBIN, DIRECT: Bilirubin, Direct: 0.3 mg/dL (ref 0.0–0.3)

## 2010-08-10 LAB — APTT
aPTT: 45 seconds — ABNORMAL HIGH (ref 24–37)
aPTT: 54 seconds — ABNORMAL HIGH (ref 24–37)

## 2010-08-10 LAB — MAGNESIUM: Magnesium: 1.6 mg/dL (ref 1.5–2.5)

## 2010-08-10 LAB — MICROALBUMIN / CREATININE URINE RATIO: Microalb Creat Ratio: 2.5 mg/g (ref 0.0–30.0)

## 2010-08-10 LAB — BODY FLUID CELL COUNT WITH DIFFERENTIAL
Neutrophil Count, Fluid: 5 % (ref 0–25)
Other Cells, Fluid: 0 %

## 2010-08-10 LAB — IGG: IgG (Immunoglobin G), Serum: 1670 mg/dL — ABNORMAL HIGH (ref 694–1618)

## 2010-08-10 LAB — AMMONIA: Ammonia: 37 umol/L — ABNORMAL HIGH (ref 11–35)

## 2010-08-10 LAB — PATHOLOGIST SMEAR REVIEW

## 2010-08-15 ENCOUNTER — Telehealth: Payer: Self-pay

## 2010-08-15 NOTE — Telephone Encounter (Signed)
Message copied by Madison Hickman on Mon Aug 15, 2010  4:32 PM ------      Message from: Dwaine Deter      Created: Fri Aug 12, 2010  5:41 PM       Normal except his diabetes is very poorly controlled. Increase the Lantus to 100 units at bedtime. Increase the Novolog to 30 units at breakfast and 30 units  at lunch. Increase this to 60 units at supper. We will follow up at his cpx.

## 2010-08-15 NOTE — Telephone Encounter (Signed)
Message copied by Madison Hickman on Mon Aug 15, 2010  4:30 PM ------      Message from: Dwaine Deter      Created: Fri Aug 12, 2010  5:41 PM       Normal except his diabetes is very poorly controlled. Increase the Lantus to 100 units at bedtime. Increase the Novolog to 30 units at breakfast and 30 units  at lunch. Increase this to 60 units at supper. We will follow up at his cpx.

## 2010-08-15 NOTE — Telephone Encounter (Signed)
Pt aware.

## 2010-08-20 ENCOUNTER — Other Ambulatory Visit: Payer: Self-pay | Admitting: Gastroenterology

## 2010-08-24 ENCOUNTER — Ambulatory Visit (INDEPENDENT_AMBULATORY_CARE_PROVIDER_SITE_OTHER): Payer: PRIVATE HEALTH INSURANCE | Admitting: Family Medicine

## 2010-08-24 ENCOUNTER — Encounter: Payer: Self-pay | Admitting: Family Medicine

## 2010-08-24 VITALS — BP 130/80 | HR 98 | Ht 67.0 in | Wt 330.0 lb

## 2010-08-24 DIAGNOSIS — Z136 Encounter for screening for cardiovascular disorders: Secondary | ICD-10-CM

## 2010-08-24 DIAGNOSIS — Z0000 Encounter for general adult medical examination without abnormal findings: Secondary | ICD-10-CM

## 2010-08-24 MED ORDER — INSULIN GLARGINE 100 UNIT/ML ~~LOC~~ SOLN: 100.0000 [IU] | Freq: Every day | SUBCUTANEOUS | Status: DC

## 2010-08-24 MED ORDER — ALLOPURINOL 100 MG PO TABS: 100.0000 mg | ORAL_TABLET | Freq: Every day | ORAL | Status: DC

## 2010-08-24 MED ORDER — INSULIN ASPART PROT & ASPART (70-30 MIX) 100 UNIT/ML ~~LOC~~ SUSP: SUBCUTANEOUS | Status: DC

## 2010-08-24 MED ORDER — EPINEPHRINE 0.15 MG/0.3ML IJ DEVI: 0.1500 mg | INTRAMUSCULAR | Status: DC | PRN

## 2010-08-24 NOTE — Progress Notes (Signed)
  Subjective:    Patient ID: Bobby Mcfarland, male    DOB: [birth year], 55 y.o.   MRN: [id number]  HPI 55 yr old male for a cpx. He feels good in general. Tries to walk some for exercise but his back pain limits this. Tries to watch his diet, but his A1c has jumped up to 12. His other labs look surprisingly good, including liver enzymes, lipids, and renal function. He is due to see Dr. Christella Hartigan in 2 weeks.    Review of Systems  Constitutional: Negative.   HENT: Negative.   Eyes: Negative.   Respiratory: Negative.   Cardiovascular: Negative.   Gastrointestinal: Negative.   Genitourinary: Negative.   Musculoskeletal: Negative.   Skin: Negative.   Neurological: Negative.   Hematological: Negative.   Psychiatric/Behavioral: Negative.        Objective:   Physical Exam  Constitutional: He is oriented to person, place, and time. He appears well-developed and well-nourished. No distress.  HENT:  Head: Normocephalic and atraumatic.  Right Ear: External ear normal.  Left Ear: External ear normal.  Nose: Nose normal.  Mouth/Throat: Oropharynx is clear and moist. No oropharyngeal exudate.  Eyes: Conjunctivae and EOM are normal. Pupils are equal, round, and reactive to light. Right eye exhibits no discharge. Left eye exhibits no discharge. No scleral icterus.  Neck: Neck supple. No JVD present. No tracheal deviation present. No thyromegaly present.  Cardiovascular: Normal rate, regular rhythm, normal heart sounds and intact distal pulses.  Exam reveals no gallop and no friction rub.   No murmur heard.      EKG normal except some right axis deviation   Pulmonary/Chest: Effort normal and breath sounds normal. No respiratory distress. He has no wheezes. He has no rales. He exhibits no tenderness.  Abdominal: Soft. Bowel sounds are normal. He exhibits no distension and no mass. There is no tenderness. There is no rebound and no guarding.  Genitourinary: Rectum normal, prostate normal and penis  normal. Guaiac negative stool. No penile tenderness.  Musculoskeletal: Normal range of motion. He exhibits no edema and no tenderness.  Lymphadenopathy:    He has no cervical adenopathy.  Neurological: He is alert and oriented to person, place, and time. He has normal reflexes. No cranial nerve deficit. He exhibits normal muscle tone. Coordination normal.  Skin: Skin is warm and dry. No rash noted. He is not diaphoretic. No erythema. No pallor.  Psychiatric: He has a normal mood and affect. His behavior is normal. Judgment and thought content normal.          Assessment & Plan:  We discussed his diet and his need to lose weight. We adjusted his insulin as above. Recheck an A1c in 90 days

## 2010-08-29 LAB — CBC
HCT: 37.3 % — ABNORMAL LOW (ref 39.0–52.0)
Hemoglobin: 11.1 g/dL — ABNORMAL LOW (ref 13.0–17.0)
Hemoglobin: 13.2 g/dL (ref 13.0–17.0)
MCHC: 33.2 g/dL (ref 30.0–36.0)
MCV: 95.9 fL (ref 78.0–100.0)
Platelets: 74 10*3/uL — ABNORMAL LOW (ref 150–400)
RBC: 3.5 MIL/uL — ABNORMAL LOW (ref 4.22–5.81)
RDW: 13.6 % (ref 11.5–15.5)
WBC: 7.5 10*3/uL (ref 4.0–10.5)

## 2010-08-29 LAB — GLUCOSE, CAPILLARY
Glucose-Capillary: 117 mg/dL — ABNORMAL HIGH (ref 70–99)
Glucose-Capillary: 124 mg/dL — ABNORMAL HIGH (ref 70–99)
Glucose-Capillary: 147 mg/dL — ABNORMAL HIGH (ref 70–99)
Glucose-Capillary: 178 mg/dL — ABNORMAL HIGH (ref 70–99)
Glucose-Capillary: 179 mg/dL — ABNORMAL HIGH (ref 70–99)
Glucose-Capillary: 228 mg/dL — ABNORMAL HIGH (ref 70–99)

## 2010-08-29 LAB — DIFFERENTIAL
Basophils Absolute: 0 10*3/uL (ref 0.0–0.1)
Basophils Relative: 0 % (ref 0–1)
Eosinophils Absolute: 0.1 10*3/uL (ref 0.0–0.7)
Eosinophils Relative: 1 % (ref 0–5)
Lymphocytes Relative: 12 % (ref 12–46)
Lymphocytes Relative: 6 % — ABNORMAL LOW (ref 12–46)
Monocytes Absolute: 1.5 10*3/uL — ABNORMAL HIGH (ref 0.1–1.0)
Monocytes Relative: 15 % — ABNORMAL HIGH (ref 3–12)
Neutro Abs: 5.3 10*3/uL (ref 1.7–7.7)
Neutrophils Relative %: 70 % (ref 43–77)

## 2010-08-29 LAB — HEPATIC FUNCTION PANEL
ALT: 35 U/L (ref 0–53)
Alkaline Phosphatase: 85 U/L (ref 39–117)
Bilirubin, Direct: 0.5 mg/dL — ABNORMAL HIGH (ref 0.0–0.3)
Indirect Bilirubin: 0.8 mg/dL (ref 0.3–0.9)

## 2010-08-29 LAB — BASIC METABOLIC PANEL
BUN: 9 mg/dL (ref 6–23)
CO2: 24 mEq/L (ref 19–32)
Chloride: 99 mEq/L (ref 96–112)
GFR calc non Af Amer: >60 mL/min (ref >60)
Glucose, Bld: 218 mg/dL — ABNORMAL HIGH (ref 70–99)
Potassium: 3.5 mEq/L (ref 3.5–5.1)
Sodium: 136 mEq/L (ref 135–145)

## 2010-08-29 LAB — WOUND CULTURE

## 2010-08-29 LAB — TSH: TSH: 2.013 u[IU]/mL (ref 0.350–4.500)

## 2010-08-29 LAB — URINALYSIS, MICROSCOPIC ONLY
Glucose, UA: >1000 mg/dL — AB
Protein, ur: 30 mg/dL — AB
pH: 5.5 (ref 5.0–8.0)

## 2010-08-29 LAB — CULTURE, BLOOD (ROUTINE X 2)
Culture: NO GROWTH
Culture: NO GROWTH

## 2010-08-29 LAB — HEMOGLOBIN A1C: Hgb A1c MFr Bld: 7.3 % — ABNORMAL HIGH (ref 4.6–6.1)

## 2010-09-05 ENCOUNTER — Other Ambulatory Visit: Payer: Self-pay | Admitting: Gastroenterology

## 2010-09-05 ENCOUNTER — Other Ambulatory Visit (INDEPENDENT_AMBULATORY_CARE_PROVIDER_SITE_OTHER): Payer: PRIVATE HEALTH INSURANCE

## 2010-09-05 DIAGNOSIS — K7460 Unspecified cirrhosis of liver: Secondary | ICD-10-CM

## 2010-09-05 LAB — COMPREHENSIVE METABOLIC PANEL
Alkaline Phosphatase: 126 U/L — ABNORMAL HIGH (ref 39–117)
BUN: 15 mg/dL (ref 6–23)
Creatinine, Ser: 0.8 mg/dL (ref 0.4–1.5)
Glucose, Bld: 136 mg/dL — ABNORMAL HIGH (ref 70–99)
Total Bilirubin: 1.5 mg/dL — ABNORMAL HIGH (ref 0.3–1.2)

## 2010-09-05 LAB — CBC WITH DIFFERENTIAL/PLATELET
Basophils Absolute: 0 10*3/uL (ref 0.0–0.1)
Eosinophils Absolute: 0.3 10*3/uL (ref 0.0–0.7)
HCT: 41.7 % (ref 39.0–52.0)
Hemoglobin: 14.8 g/dL (ref 13.0–17.0)
Lymphs Abs: 1.1 10*3/uL (ref 0.7–4.0)
MCHC: 35.4 g/dL (ref 30.0–36.0)
Neutro Abs: 5.1 10*3/uL (ref 1.4–7.7)
RDW: 14.1 % (ref 11.5–14.6)

## 2010-09-05 NOTE — Progress Notes (Signed)
Pt aware.

## 2010-09-06 LAB — GLUCOSE, CAPILLARY: Glucose-Capillary: 168 mg/dL — ABNORMAL HIGH (ref 70–99)

## 2010-09-07 ENCOUNTER — Encounter: Payer: Self-pay | Admitting: Gastroenterology

## 2010-09-07 ENCOUNTER — Ambulatory Visit (INDEPENDENT_AMBULATORY_CARE_PROVIDER_SITE_OTHER): Payer: PRIVATE HEALTH INSURANCE | Admitting: Gastroenterology

## 2010-09-07 VITALS — BP 122/74 | HR 80 | Ht 68.0 in | Wt 331.8 lb

## 2010-09-07 DIAGNOSIS — K7460 Unspecified cirrhosis of liver: Secondary | ICD-10-CM

## 2010-09-07 MED ORDER — NADOLOL 20 MG PO TABS: 20.0000 mg | ORAL_TABLET | Freq: Every day | ORAL | Status: DC

## 2010-09-07 MED ORDER — SPIRONOLACTONE 50 MG PO TABS: 50.0000 mg | ORAL_TABLET | Freq: Two times a day (BID) | ORAL | Status: DC

## 2010-09-07 MED ORDER — FUROSEMIDE 20 MG PO TABS: 20.0000 mg | ORAL_TABLET | Freq: Every day | ORAL | Status: DC

## 2010-09-07 MED ORDER — POTASSIUM CHLORIDE ER 10 MEQ PO CPCR: 10.0000 meq | ORAL_CAPSULE | Freq: Every day | ORAL | Status: DC

## 2010-09-07 NOTE — Patient Instructions (Addendum)
You will be set up for an ultrasound of liver to check for tumors.  [facility]  11 am 09/09/10 Arrive 10:45 am.  Please have nothing to eat or drink after 5 am. You will have labs checked in two weeks (May 2)  in the basement lab.  Please head down after you check out with the front desk  (AFP, bmet) It is important that you have a relatively low salt diet.  High salt diet can cause fluid to accumulate in your legs, abdomen and even around your lungs. You should try to avoid NSAID type over the counter pain medicines as best as possible. Tylenol is safe to take for 'routine' aches and pains, but never take more than 1/2 the dose suggested on the package instructions (never more than 2 grams per day). Avoid alcohol. Return to see Dr. Christella Hartigan in 6 months, sooner if needed. A copy of this information will be made available to Dr. Clent Ridges. You will start lasix 20mg  pill, once daily.  Otherwise continue all your meds, all refilled today.

## 2010-09-07 NOTE — Progress Notes (Signed)
Review of gastrointestinal problems:  1. Tubular adenoma, colonoscopy Dr. Jarold Motto 06/1008; recall at 5 years.  2. Cirrhosis: diagnosed in 2011: Complicated by ascites, portal hypertension, esophageal and gastric varices that have never overtly bled, thrombocytopenia, elevated protime (INR 1.3). Laboratory workup March 2011: hepatitis A., B., C. negative. ANA negative. Normal ceruloplasmin. Alpha one antitrypsin level normal. Ferritin, other iron studies normal. AMA negative. ASMA negative.   Large-volume paracentesis March 2011; no bacterial peritonitis, + elevated SAAG.   last imaging March, 2011: ultrasound and CT scan showed cirrhosis, splenomegaly, no clear discrete liver masses   Last EGD March 2011: small gastric and esophageal varices, portal gastropathy, ulcer in duodenum, biopsies of stomach showed no H. pylori ( he was taking a lot of NSAIDs at the time). On Nadolol, increased to 40mg  a day in oct 2011.   last afp July 2011 normal   started hep A/B immunizations in 10/2009  HPI: This is a very pleasant 55 year old man whom I last saw about 6 months ago.   Labs including complete metabolic profile, coags, CBC two days ago look at his usual. (thrombocytopenia, slightly elevated liver tests, slightly elevated INR) He's been feeling fine.  Walks 3-4 days per week 1-2 miles, limited by pains in low back.   Had some minor red rectal bleeding, limited 8 weeks ago.    Physical Exam: BP 122/74  Pulse 80  Ht 5\' 8"  (1.727 m)  Wt 331 lb 12.8 oz (150.503 kg)  BMI 50.45 kg/m2 Constitutional: generally well-appearing, he is morbidly obese Psychiatric: alert and oriented x3 Abdomen: soft, nontender, nondistended, no obvious ascites, no peritoneal signs, normal bowel sounds 1+ lower extremity edema     Assessment and plan: 55 y.o. male with  Cirrhosis  He has 1+ pitting edema in his ankles bilaterally and I would like to restart him on Lasix, 20 mg once daily. He'll have repeat set of  electrolytes in about 10 days. The same time he'll get alpha fetoprotein tested. He is due for that lab test as well as imaging of his liver to screen for hepatoma. We will set him up for abdominal ultrasound. Otherwise he'll continue his current medicines and return to see me in 6 months, sooner if needed.

## 2010-09-09 ENCOUNTER — Other Ambulatory Visit (HOSPITAL_COMMUNITY): Payer: PRIVATE HEALTH INSURANCE

## 2010-09-09 ENCOUNTER — Telehealth: Payer: Self-pay | Admitting: Gastroenterology

## 2010-09-09 DIAGNOSIS — K7460 Unspecified cirrhosis of liver: Secondary | ICD-10-CM

## 2010-09-09 MED ORDER — FUROSEMIDE 40 MG PO TABS: 40.0000 mg | ORAL_TABLET | Freq: Every day | ORAL | Status: DC

## 2010-09-09 NOTE — Telephone Encounter (Signed)
Pt aware lasix 40 called to pharmacy

## 2010-09-09 NOTE — Telephone Encounter (Signed)
I don't recall talking with him about increasing the dose, his med list showed he was not on lasix and when I mentioned the med he didn't say he was already on it.  He should increase the dose to 60mg  a day, otherwise same recs

## 2010-09-09 NOTE — Telephone Encounter (Signed)
Dr Christella Hartigan the pt called in after he picked up his 20 mg lasix, he states he has also been taking 40 mg lasix and needs a refill on that for a total of 60 mg per day.  In the medication list his 40mg  lasix was discontinued but he says you discussed with him to increase the dose to 60 not lower to 20.  Do you want me to change the rx. He will be out of the 40mg  today and will be on 22 0nly but he states he has a lot of swelling.

## 2010-09-19 ENCOUNTER — Ambulatory Visit (HOSPITAL_COMMUNITY)
Admission: RE | Admit: 2010-09-19 | Discharge: 2010-09-19 | Disposition: A | Discharge status: Home / Self Care | Payer: PRIVATE HEALTH INSURANCE | Source: Ambulatory Visit | Attending: Gastroenterology | Admitting: Gastroenterology

## 2010-09-19 DIAGNOSIS — K8020 Calculus of gallbladder without cholecystitis without obstruction: Secondary | ICD-10-CM | POA: Insufficient documentation

## 2010-09-19 DIAGNOSIS — K7460 Unspecified cirrhosis of liver: Secondary | ICD-10-CM | POA: Insufficient documentation

## 2010-09-19 DIAGNOSIS — R161 Splenomegaly, not elsewhere classified: Secondary | ICD-10-CM | POA: Insufficient documentation

## 2010-09-20 ENCOUNTER — Telehealth: Payer: Self-pay

## 2010-09-20 NOTE — Telephone Encounter (Signed)
Message copied by Chales Abrahams on Tue Sep 20, 2010 11:20 AM ------      Message from: Chales Abrahams      Created: Tue Jul 26, 2010  1:35 PM       Pt needs yearly booster twin rix

## 2010-09-20 NOTE — Telephone Encounter (Signed)
Pt aware to schedule appt for Twin rix booster he will call back to set that up

## 2010-10-04 NOTE — Consult Note (Signed)
Bobby Mcfarland, Bobby Mcfarland               ACCOUNT NO.:  [id number]   MEDICAL RECORD NO.:  [id number]          PATIENT TYPE:  INP   LOCATION:  1308                         FACILITY:  [facility]   PHYSICIAN:  Sandria Bales. Ezzard Standing, M.D.  DATE OF BIRTH:  25-May-1955   DATE OF CONSULTATION:  10/24/2008  DATE OF DISCHARGE:                                 CONSULTATION   Date of consultation ?   REASON FOR CONSULTATION:  Right thigh infection.   REFERRING PHYSICIAN:  Sean A. Everardo All, MD.   HISTORY OF ILLNESS:  Bobby Mcfarland is a 55 year old white male, who is a  patient of Dr. Gershon Crane, who was admitted on October 23, 2008 for  cellulitis of his right thigh/groin.  He has had multiple of these  abscesses; he is guessing 10 or 12 times before.  Usually they will come  to a head and drain, and nothing much comes of them.  Unfortunately,  this time he has had increasing cellulitis without much drainage.  He  saw Dr. Clent Ridges yesterday, who thought he would be best served by putting  him in the hospital; and Dr. Everardo All called me today concerned about  possible abscess in this area, for me to evaluate.   ADMISSION MEDICATIONS:  1. Guaifenesin 2 mg daily.  2. Ramipiril 5 mg daily.  3. Doxycycline 100 mg (that he just started on Tuesday).  4. Januvia 100 mg daily.  5. Avandamet 1000 mg twice a day.  6. Glipizide XL 10 mg twice a day.  7. Simvastatin 40 mg daily.  8. Allopurinol 100 mg daily.  9. Enteric-coated aspirin 325 mg daily.   PAST MEDICAL HISTORY:  Comorbidities include:  1. He is morbidly obese; knows that his weight is the major problem      involving these wound infections.  2. He has hypertension.  3. He has noninsulin-dependent diabetes mellitus.  Again, he does      understand this is weight related.  4. He has hypercholesterolemia.  5. He has a history of kidney stones.  6. He has gout, on allopurinol.  7. He has sleep apnea, on CPAP nightly.   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   PHYSICAL EXAMINATION:  Temperature 101.3, pulse 100, blood pressure  112/53.  He is a morbidly obese white male; pleasant, alert and  cooperative.  ABDOMEN:  Soft.  SKIN:  On his arms he has a kind of superficial irritation, where it  looks like he has been scratching his arm.  He says he had a little bit  of dryness and eczema to both arms.  He has a panniculus with moisture under his panniculus of his abdomen.  His right groin has an indurated area that is 8-10 cm in diameter, that  I think may harbor an abscess.  EXTREMITIES:  He has chronic venous stasis changes of both lower  extremities, but he has palpable pedal pulses.   LABS:  (that I have) White blood count 11,900, hemoglobin 13, hematocrit  37.  His platelet count was 74,000, for which I am not really sure why.  Sodium 135, potassium 3.5, glucose most recently 143.  Alkaline  phosphatase 47, albumin 3.0.  Hemoglobin A1c 7.3.  TSH 2.013.   IMPRESSION:  1. Right groin abscess:  Plan incision and drainage of his abscessed      area.  2. Thrombocytopenia:  Etiology unclear.  3. Morbid obesity:  He knows his weight is bad for his health.  4. Hypertension.  5. Noninsulin-dependent diabetes mellitus.  6. Hypercholesterolemia.  7. Sleep apnea.  8. Chronic venous stasis disease.      Sandria Bales. Ezzard Standing, M.D.  Electronically Signed     DHN/MEDQ  D:  10/24/2008  T:  10/25/2008  Job:  [id number]   cc:   Gregary Signs A. Everardo All, MD  [address]  [place]  [place] [address]   Tera Mater. Clent Ridges, MD  [address] [place]  [place] [address]

## 2010-10-04 NOTE — Discharge Summary (Signed)
Bobby Mcfarland, Bobby Mcfarland NO.:  [id number]   MEDICAL RECORD NO.:  [id number]          PATIENT TYPE:  INP   LOCATION:  1308                         FACILITY:  [facility]   PHYSICIAN:  Corwin Levins, MD      DATE OF BIRTH:  [birth year]   DATE OF ADMISSION:  10/23/2008  DATE OF DISCHARGE:  10/26/2008                               DISCHARGE SUMMARY   DISCHARGE DIAGNOSES:  1. Right thigh abscess.  2. Diabetes mellitus.  3. Hypertension.  4. Morbid obesity.  5. Hyperlipidemia.  6. Gout.   PROCEDURE:  I and D of the right thigh abscess per general surgery.   CONSULTATIONS:  General surgery as above.   History and physical, see the __________ date of admission.   HOSPITAL COURSE:  Bobby Mcfarland is a 55 year old white male who presented  with a right thigh abscess requiring I and D for general surgery after  failing outpatient doxycycline therapy.  He did very nicely with the  initial therapy as well as IV antibiotics including cephalosporin and  vancomycin IV.  He remained afebrile, and he tolerated the medications  well.  By the time of discharge he is pain free, and although it was  felt initially he might require IV antibiotics to go home he is doing  quite nicely at this point with right thigh abscess site with minimal  cellulitis and minimal tenderness and no pain per patient.  He is  afebrile.  His CBGs remained in the low 100s.  Overall he is doing quite  nicely, and is felt to have gained maximum benefit from this  hospitalization and is to be discharged home.  This was confirmed  earlier in the day by Dr. Ezzard Standing of general surgery who okay'd to go  home and will arrange home health care as well.   DISPOSITION:  Discharged to home in good condition.  There are no  activity or dietary restrictions except for diabetic diet as before.  He  is to follow up with Dr. Clent Ridges, his primary care physician, in 1-2 weeks  as well as Dr. Ezzard Standing of general surgery in 7-14 days.   DISCHARGE MEDICATIONS:  1. Septra 1 p.o. b.i.d.  2. __________ 2 mg q.a.m.  3. Ramipril 5 mg q.a.m.  4. Januvia 100 mg q.a.m.  5. Avandamet 08/998 one p.o. b.i.d.  6. Glipizide XL 10 mg b.i.d.  7. Simvastatin 40 mg p.o. daily.  8. Allopurinol 100 mg p.o. daily.  9. Multivitamin daily.  10.Coated aspirin 325 mg 1 p.o. daily.      Corwin Levins, MD  Electronically Signed     JWJ/MEDQ  D:  10/26/2008  T:  10/26/2008  Job:  [id number]

## 2010-10-04 NOTE — Op Note (Signed)
Bobby Mcfarland, Bobby Mcfarland               ACCOUNT NO.:  [id number]   MEDICAL RECORD NO.:  [id number]          PATIENT TYPE:  INP   LOCATION:  1308                         FACILITY:  [facility]   PHYSICIAN:  Sandria Bales. Ezzard Standing, M.D.  DATE OF BIRTH:  23-Jun-1955   DATE OF PROCEDURE:  10/24/2008  DATE OF DISCHARGE:                               OPERATIVE REPORT   Date of Surgery ?   PREOPERATIVE DIAGNOSIS:  Right thigh cellulitis, possible abscess.   POSTOPERATIVE DIAGNOSIS:  Right thigh abscess.   PROCEDURE:  Incision and drainage of right thigh abscess.   SURGEON:  Sandria Bales. Ezzard Standing, MD.   ANESTHESIA:  9 mL of 1% Xylocaine.   COMPLICATIONS:  None.   INDICATION FOR PROCEDURE:  [name] is a 55 year old white male, who  presents with a right thigh cellulitis abscess.   PRIMARY CARE DOCTOR:  Tera Mater. Clent Ridges, MD.   I discussed with the patient about doing an incision and drainage of  this area.  The risks include bleeding,  recurrence of the abscesses.   Patient in the supine position, I prepped his right groin with Betadine  solution.  I infiltrated with about 9 mL of 1% Xylocaine.  I then  incised into an abscess cavity, which was about 3 or 4 cm in diameter,  maybe about the size of golf ball, and got foul-smelling pus out of  this.   I then packed it with saline gauze.  We will start dressing changes  twice a day.  He tolerated the procedure well.      Sandria Bales. Ezzard Standing, M.D.  Electronically Signed     DHN/MEDQ  D:  10/24/2008  T:  10/25/2008  Job:  [id number]   cc:   Gregary Signs A. Everardo All, MD  [address]  [place]  [place] [address]   Tera Mater. Clent Ridges, MD  [address] [place]  [place] [address]

## 2010-10-04 NOTE — Assessment & Plan Note (Signed)
[facility]                         GASTROENTEROLOGY OFFICE NOTE   Bobby Mcfarland, Bobby Mcfarland                      MRN:          [id number]  DATE:10/23/2006                            DOB:          [birth year]    REFERRING PHYSICIAN:  Tera Mater. Clent Ridges, MD   DATE OF BIRTH:  November 10, 1955.   REASON FOR CONSULTATION:  Dr. Tera Mater. Clent Ridges has asked me to evaluate  Mr. Bram in consultation regarding a colorectal cancer screening with a  colonoscopy.   HISTORY OF PRESENT ILLNESS:  Mr. Kolodziejski is a very pleasant 55 year old  man who has never had colorectal cancer screening.  He has no  constipation, diarrhea or rectal bleeding.  He believes he does have  some hemorrhoids and has intermittent pruritus ani that is usually self-  limited.   REVIEW OF SYSTEMS:  Notable for no overt GI bleeding.  Slow weight loss  over the past three years.  This is intentional.  The rest of his review  of systems is essentially normal and is available on the nursing intake  sheet.   PAST MEDICAL HISTORY:  1. Morbid obesity.  2. Hypertension.  3. Diabetes.  4. Elevated cholesterol.  5. Kidney stones.  6. Sleep apnea.  He is on a nasal CPAP machine nightly for the past      eight years.   CURRENT MEDICATIONS:  1. Tenex.  2. Allopurinol.  3. Altace.  4. Avandamet.  5. Glipizide.  6. Indomethacin.  7. Crestor.   ALLERGIES:  No known drug allergies.   SOCIAL HISTORY:  Married.  No children.  Works in Clinical biochemist.  Smokes three cigars a week.  Drinks alcohol socially.   FAMILY HISTORY:  No colon cancer or colon polyps in the family.   PHYSICAL EXAMINATION:  VITAL SIGNS:  Height 5 feet 10 inches, weight 345  pounds, blood pressure 136/80, pulse 88.  CONSTITUTIONAL:  Morbidly obese, otherwise well-appearing.  NEUROLOGIC:  Alert and oriented x3.  HEENT:  Eyes:  Extraocular movements intact.  Mouth:  Oropharynx moist,  no lesions.  NECK:  Supple, no  lymphadenopathy.  CARDIOVASCULAR:  Heart has a regular rate and rhythm.  LUNGS:  Clear to auscultation bilaterally.  ABDOMEN:  Soft, nontender, non-distended.  Normal bowel sounds.  EXTREMITIES:  No lower extremity edema.  SKIN:  No rash or lesions on the visible extremities.   ASSESSMENT/PLAN:  A 55 year old man at routine risk for colorectal  cancer.   We will arrange for Mr. Pollitt to have a colonoscopy.  With his sleep  apnea and nasal CPAP requirements, this is safest to be done at the  hospital.  I see no reason for any further blood tests or imaging  studies prior to that.  He will bring his mask with him and we will  alert respiratory therapy when he arrives, to assist with hooking up to  a CPAP machine.     Rachael Fee, MD  Electronically Signed    DPJ/MedQ  DD: 10/23/2006  DT: 10/23/2006  Job #: [id number]   cc:   Tera Mater. Clent Ridges, MD

## 2010-10-05 ENCOUNTER — Telehealth: Payer: Self-pay | Admitting: Gastroenterology

## 2010-10-05 NOTE — Telephone Encounter (Signed)
Pharmacy calling to verify dosage of medication.  They were informed that the pt should be on 60 mg daily.

## 2010-10-07 NOTE — Assessment & Plan Note (Signed)
[facility] OFFICE NOTE   Bobby Mcfarland, Bobby Mcfarland                      MRN:          [id number]  DATE:09/14/2006                            DOB:          12-22-1955    This is a 55 year old gentleman here for a complete physical  examination.  He has several things he would like me to address,  primarily with skin rashes.  First off, about six months ago, he  developed an itchy rash which comes and goes over his hands and wrists.  Also, for the past year, he has had an itchy rash on the bottoms of both  feet.  He has been  applying over-the-counter moisturizing creams  intermittently.  Otherwise, he says his gout attacks are infrequent.  He  continues to sleep well, using a CPAP machine.  He thinks his blood  pressure has been stable.  He does not check his blood sugars,  unfortunately.  He states that he watches his diet closely, but I highly  doubt this, given the changes in his lab work, as noted below.  He  admits to getting no exercise at all.   For details of his past medical history, family history, social history,  habits, etc., refer to our last physical note dated July 21, 2005.   ALLERGIES:  BEE STINGS.   CURRENT MEDICATIONS:  1. Tenex 2 mg daily.  2. Allopurinol 100 mg per day.  3. Altace 5 mg per day.  4. A CPAP machine at night.  5. Avandamet 08/998 b.i.d.  6. Glipizide 10 mg b.i.d.  7. Indomethacin 50 mg as needed for gout pain.  8. An EpiPen as needed.   OBJECTIVE:  VITAL SIGNS:  Height 5 feet 9 inches.  Weight 345.  BP  148/72, pulse 100 and regular.  GENERAL:  He remains morbidly obese.  SKIN:  Clear.  HEENT:  Eyes are clear.  Ears are clear.  Pharynx is clear.  NECK:  Supple without lymphadenopathy or masses.  LUNGS:  Clear.  CARDIAC:  Regular rate and rhythm without murmurs, rubs or gallops.  Distal pulses are full.  EKG is within normal limits.  ABDOMEN:  Soft.  Normal bowel sounds.   Nontender.  No masses.  GENITALIA:  Normal male.  He is circumcised.  RECTAL:  No masses or tenderness.  Prostate is within normal limits.  Stool hemoccult negative.  EXTREMITIES:  No clubbing, cyanosis or edema.  NEUROLOGIC:  Grossly intact.   He is here for fasting labs on April 18th.  These were remarkable for an  elevated glucose of 269.  His hemoglobin A1C has jumped to 10.1 (from 7  one year ago).  His lipid panel shows triglycerides up to 322, HDL low  at 33, LDL high at 94, otherwise his labs are within normal limits.   ASSESSMENT/PLAN:  1. Complete physical examination:  We spent some time talking about      observing his diet more closely, getting more exercise, and the      absolute necessity of losing weight.  We will also set him  up for      his first screening colonoscopy.  2. Eczema on his hands:  He has a scaly red rash on his hands.  Will      try triamcinolone 0.1% cream b.i.d. as needed.  3. He also has a scaly red rash on the soles of his feet in between      his toes, consistent with tinea pedis.  Will begin Nizoral cream      b.i.d. as needed.  4. Type 2 diabetes mellitus, poorly controlled, in addition to weight      loss and dietary changes, will begin Jenuvia 100 mg once daily.      Will see him back for a recheck in 90 days.  5. Hyperlipidemia:  Will begin Crestor 10 mg once daily.  Will see him      back for a recheck in 90 days.  6. Hypertension:  Stable.  7. Sleep apnea:  Stable.  8. Gout:  Stable.     Tera Mater. Clent Ridges, MD  Electronically Signed    SAF/MedQ  DD: 09/14/2006  DT: 09/14/2006  Job #: [id number]

## 2010-12-09 ENCOUNTER — Other Ambulatory Visit: Payer: Self-pay | Admitting: Gastroenterology

## 2011-01-26 ENCOUNTER — Other Ambulatory Visit: Payer: Self-pay | Admitting: Family Medicine

## 2011-03-10 ENCOUNTER — Other Ambulatory Visit: Payer: Self-pay | Admitting: Gastroenterology

## 2011-04-24 ENCOUNTER — Other Ambulatory Visit: Payer: Self-pay | Admitting: Family Medicine

## 2011-06-08 ENCOUNTER — Other Ambulatory Visit: Payer: Self-pay | Admitting: Gastroenterology

## 2011-06-12 ENCOUNTER — Telehealth: Payer: Self-pay | Admitting: Gastroenterology

## 2011-06-12 MED ORDER — SPIRONOLACTONE 50 MG PO TABS: 50.0000 mg | ORAL_TABLET | Freq: Two times a day (BID) | ORAL | Status: DC

## 2011-06-12 NOTE — Telephone Encounter (Signed)
Pt appt made for 07/05/11

## 2011-07-05 ENCOUNTER — Ambulatory Visit: Payer: PRIVATE HEALTH INSURANCE | Admitting: Gastroenterology

## 2011-07-17 ENCOUNTER — Encounter: Payer: Self-pay | Admitting: Family Medicine

## 2011-07-17 ENCOUNTER — Ambulatory Visit (INDEPENDENT_AMBULATORY_CARE_PROVIDER_SITE_OTHER): Payer: PRIVATE HEALTH INSURANCE | Admitting: Family Medicine

## 2011-07-17 VITALS — BP 118/72 | HR 81 | Temp 98.3°F | Wt 340.0 lb

## 2011-07-17 DIAGNOSIS — E119 Type 2 diabetes mellitus without complications: Secondary | ICD-10-CM

## 2011-07-17 DIAGNOSIS — L02419 Cutaneous abscess of limb, unspecified: Secondary | ICD-10-CM

## 2011-07-17 MED ORDER — DOXYCYCLINE HYCLATE 100 MG PO CAPS: 100.0000 mg | ORAL_CAPSULE | Freq: Two times a day (BID) | ORAL | Status: DC

## 2011-07-17 NOTE — Progress Notes (Signed)
  Subjective:    Patient ID: Bobby Mcfarland, male    DOB: 1955-12-13, 56 y.o.   MRN: [id number]  HPI Here for 4 days of redness and mild tenderness in the left leg, similar to the cellulitis he had about 2 years ago. He thinks he had a fever one day this past weekend but not now. He has not checked his glucoses for a long time. He does swim at the [facility] 3 days a week.    Review of Systems  Constitutional: Negative.   Respiratory: Negative.   Cardiovascular: Positive for leg swelling.       Objective:   Physical Exam  Constitutional: He appears well-developed and well-nourished.  Cardiovascular: Normal rate, regular rhythm, normal heart sounds and intact distal pulses.   Pulmonary/Chest: Effort normal and breath sounds normal. He has no wheezes. He has no rales.  Musculoskeletal:       The left leg is swollen, red, warm, and tender from the mid thigh to just below the knee          Assessment & Plan:  Treat the cellulitis with Doxycyline. He needs a cpx with fasting labs soon.

## 2011-07-25 ENCOUNTER — Ambulatory Visit: Payer: PRIVATE HEALTH INSURANCE | Admitting: Gastroenterology

## 2011-07-27 IMAGING — US US ABDOMEN COMPLETE
1 series · 14 of 25 positions shown · non-contrast
Comparison: CT 08/12/2009

CLINICAL DATA: Cirrhosis.  Evaluate hepatoma.

COMPLETE ABDOMINAL ULTRASOUND

[Series 1: us abdomen complete · 0.42mm/px · 14 of 92 slices shown]
[im 1/92]
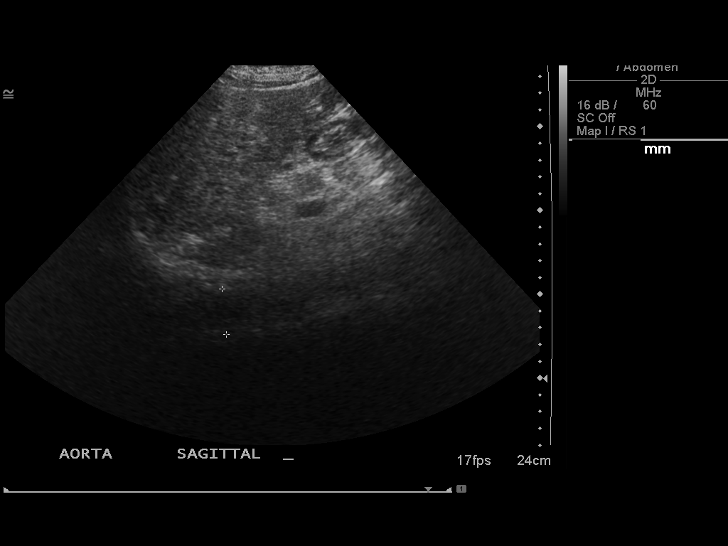
[im 8/92]
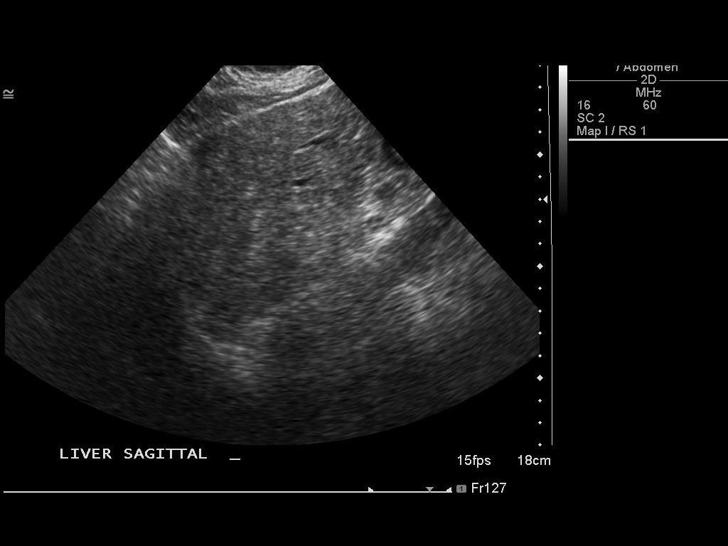
[im 16/92]
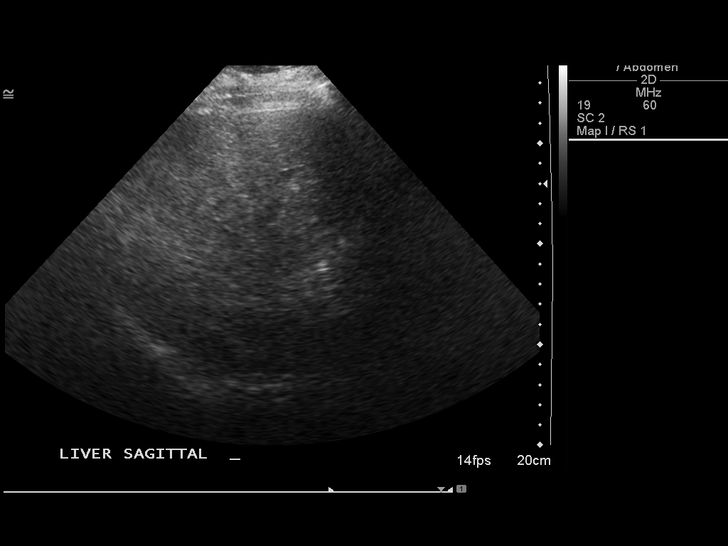
[im 23/92]
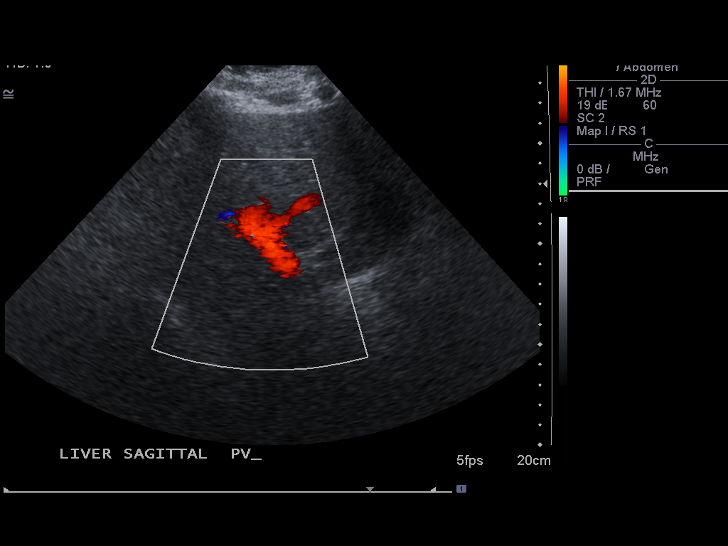
[im 31/92]
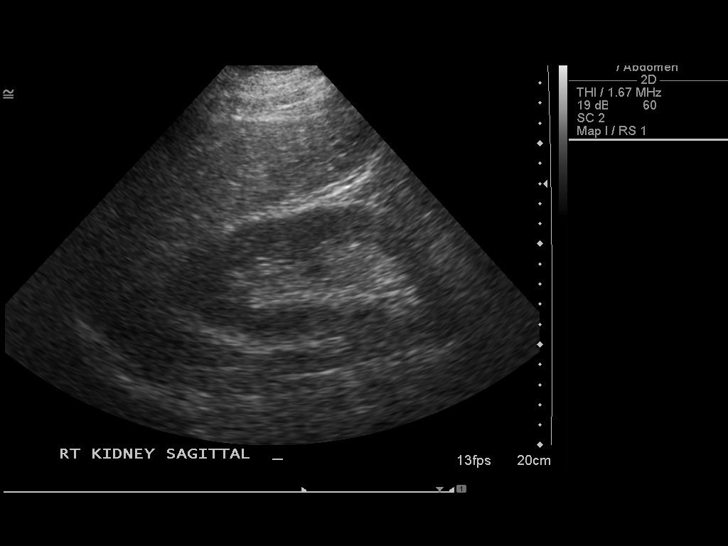
[im 35/92]
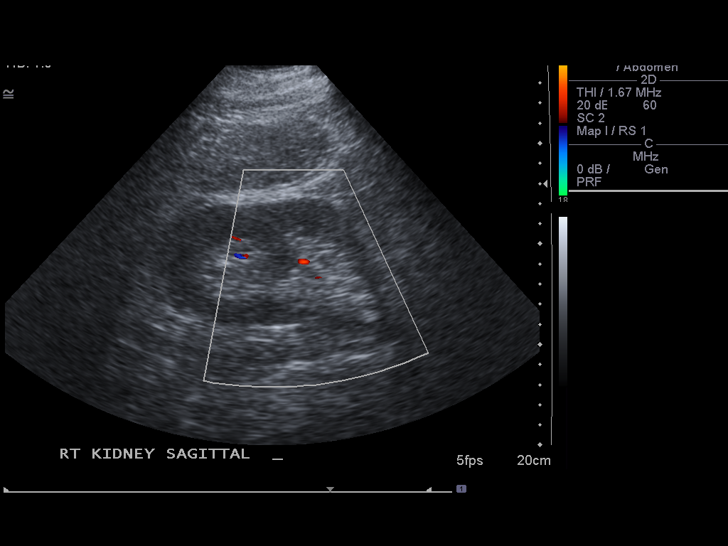
[im 42/92]
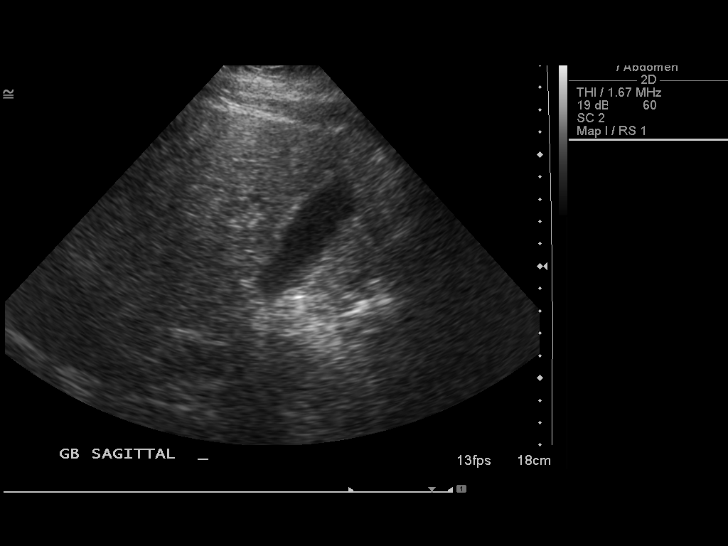
[im 50/92]
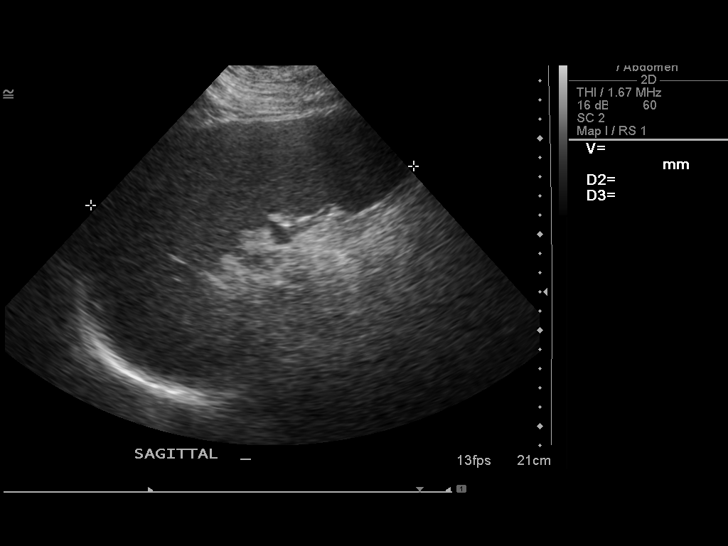
[im 57/92]
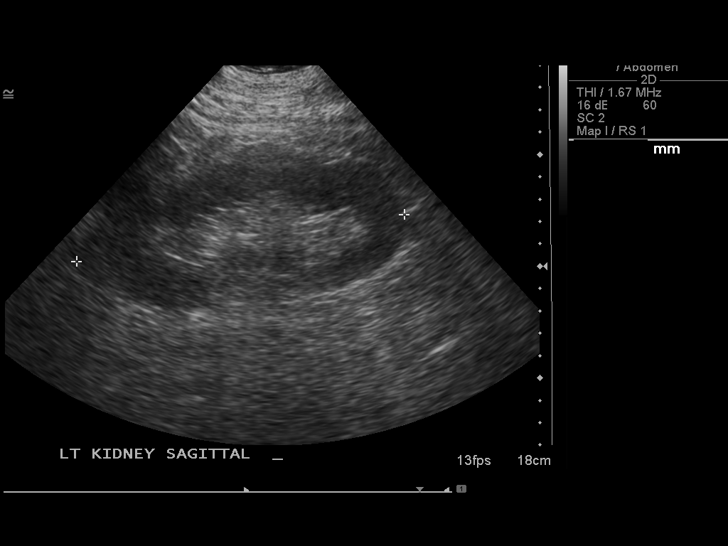
[im 61/92]
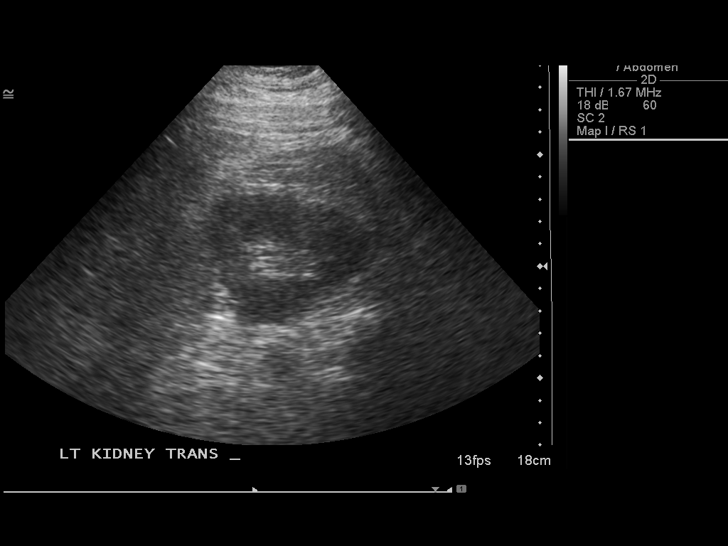
[im 69/92]
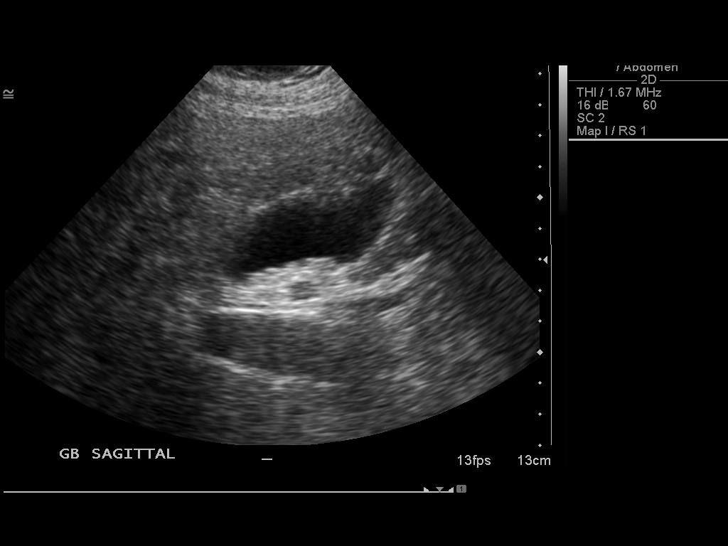
[im 76/92]
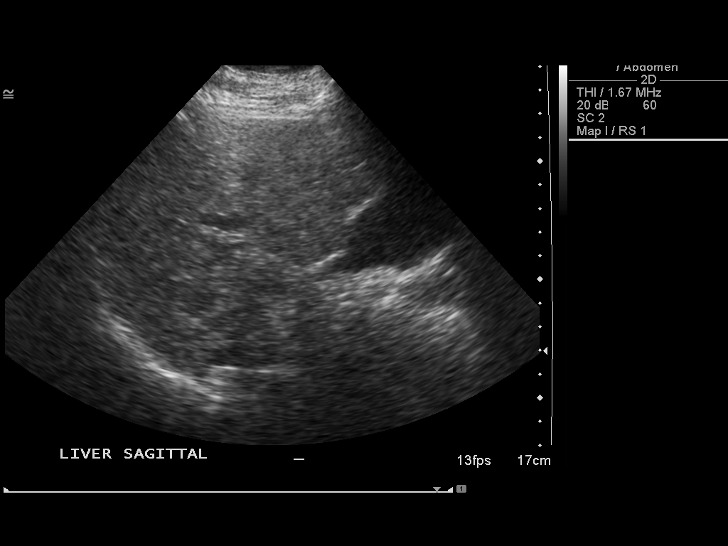
[im 84/92]
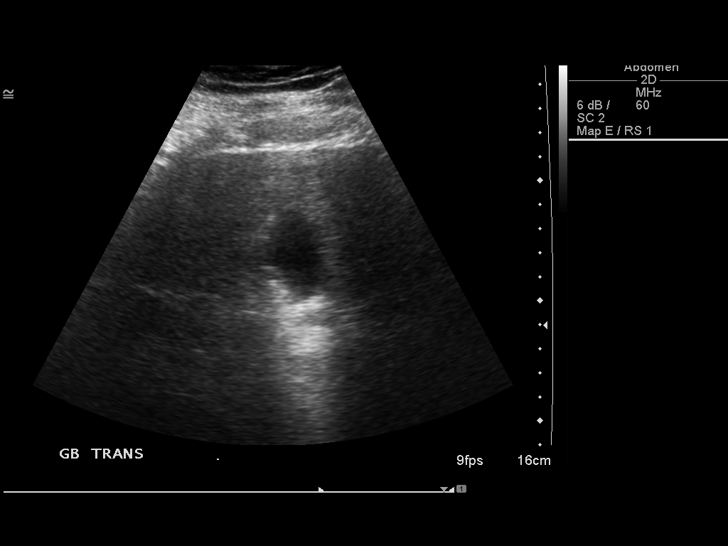
[im 92/92]
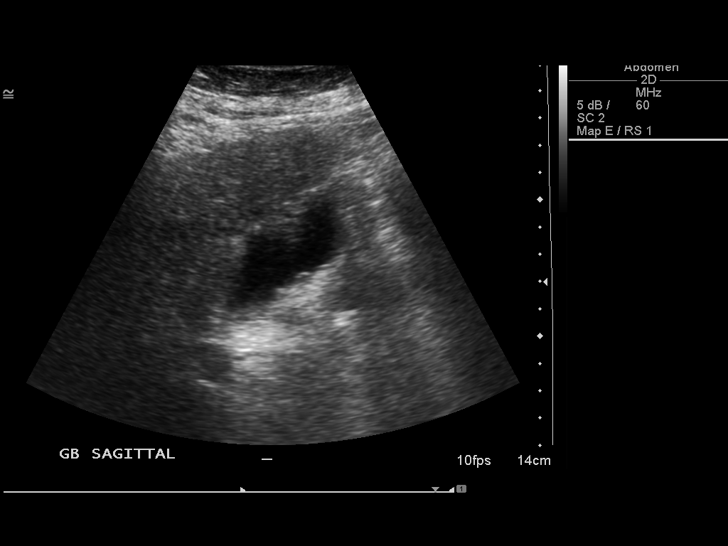

[14 of 25 positions shown; findings below may reference images not displayed]

FINDINGS: Gallbladder:  There are small dependent gallstones within the
gallbladder.  No gallbladder wall thickening or pericholecystic
fluid.

Common bile duct:  Normal at 4  mm.

Liver:  The liver has a coarse echotexture.  No focal hepatic
lesion is evident.  No intrahepatic biliary duct dilatation.

IVC:  Appears normal.

Pancreas:  No focal abnormality seen.

Spleen:  The spleen is enlarged with a calculated volume of 1011
ml.

Right Kidney:  14.9cm in length.  No evidence of hydronephrosis or
stones.

Left Kidney:  14.8cm in length.  No evidence of hydronephrosis or
stones.

Abdominal aorta:  No aneurysm identified.
IMPRESSION: 1.  Course  liver echotexture likely related to the patient's
hepatic cellular disease (cirrhosis).
2.  No evidence of hepatoma by ultrasound.
3.  No ascites.
4.  Splenomegaly.
5.  Cholelithiasis without evidence of cholecystitis.

## 2011-07-31 ENCOUNTER — Other Ambulatory Visit: Payer: Self-pay | Admitting: Family Medicine

## 2011-07-31 NOTE — Telephone Encounter (Signed)
Last seen 07/17/11 cellulitis Last written 07/17/11 # [id number]  0RF Please advise

## 2011-09-01 ENCOUNTER — Other Ambulatory Visit: Payer: Self-pay | Admitting: Family Medicine

## 2011-09-01 ENCOUNTER — Other Ambulatory Visit: Payer: Self-pay | Admitting: Gastroenterology

## 2011-09-06 ENCOUNTER — Other Ambulatory Visit (INDEPENDENT_AMBULATORY_CARE_PROVIDER_SITE_OTHER): Payer: PRIVATE HEALTH INSURANCE

## 2011-09-06 DIAGNOSIS — E119 Type 2 diabetes mellitus without complications: Secondary | ICD-10-CM

## 2011-09-06 DIAGNOSIS — K7460 Unspecified cirrhosis of liver: Secondary | ICD-10-CM

## 2011-09-06 DIAGNOSIS — Z0000 Encounter for general adult medical examination without abnormal findings: Secondary | ICD-10-CM

## 2011-09-06 LAB — AFP TUMOR MARKER: AFP-Tumor Marker: 2.7 ng/mL (ref 0.0–8.0)

## 2011-09-06 LAB — POCT URINALYSIS DIPSTICK
Bilirubin, UA: NEGATIVE
Blood, UA: NEGATIVE
Glucose, UA: NEGATIVE
Ketones, UA: NEGATIVE
Spec Grav, UA: 1.01

## 2011-09-06 LAB — CBC WITH DIFFERENTIAL/PLATELET
Basophils Absolute: 0 10*3/uL (ref 0.0–0.1)
Basophils Relative: 0.5 % (ref 0.0–3.0)
Eosinophils Relative: 2.1 % (ref 0.0–5.0)
HCT: 40.9 % (ref 39.0–52.0)
Hemoglobin: 13.8 g/dL (ref 13.0–17.0)
Lymphocytes Relative: 13.3 % (ref 12.0–46.0)
Lymphs Abs: 1 10*3/uL (ref 0.7–4.0)
Monocytes Relative: 10.2 % (ref 3.0–12.0)
Neutro Abs: 5.8 10*3/uL (ref 1.4–7.7)
RBC: 4.11 Mil/uL — ABNORMAL LOW (ref 4.22–5.81)
RDW: 14.7 % — ABNORMAL HIGH (ref 11.5–14.6)

## 2011-09-06 LAB — HEPATIC FUNCTION PANEL
ALT: 40 U/L (ref 0–53)
AST: 51 U/L — ABNORMAL HIGH (ref 0–37)
Alkaline Phosphatase: 115 U/L (ref 39–117)
Total Bilirubin: 1 mg/dL (ref 0.3–1.2)

## 2011-09-06 LAB — LIPID PANEL
Cholesterol: 153 mg/dL (ref 0–200)
HDL: 38.7 mg/dL — ABNORMAL LOW (ref >39.00)
LDL Cholesterol: 94 mg/dL (ref 0–99)
Total CHOL/HDL Ratio: 4
Triglycerides: 101 mg/dL (ref 0.0–149.0)
VLDL: 20.2 mg/dL (ref 0.0–40.0)

## 2011-09-06 LAB — MICROALBUMIN / CREATININE URINE RATIO: Creatinine,U: 14.5 mg/dL

## 2011-09-06 LAB — BASIC METABOLIC PANEL
BUN: 20 mg/dL (ref 6–23)
Chloride: 99 mEq/L (ref 96–112)
Potassium: 4 mEq/L (ref 3.5–5.1)
Sodium: 134 mEq/L — ABNORMAL LOW (ref 135–145)

## 2011-09-13 ENCOUNTER — Encounter: Payer: Self-pay | Admitting: Family Medicine

## 2011-09-13 ENCOUNTER — Ambulatory Visit (INDEPENDENT_AMBULATORY_CARE_PROVIDER_SITE_OTHER): Payer: PRIVATE HEALTH INSURANCE | Admitting: Family Medicine

## 2011-09-13 VITALS — BP 114/70 | HR 76 | Temp 98.2°F | Ht 68.25 in | Wt 342.0 lb

## 2011-09-13 DIAGNOSIS — L608 Other nail disorders: Secondary | ICD-10-CM

## 2011-09-13 DIAGNOSIS — L602 Onychogryphosis: Secondary | ICD-10-CM

## 2011-09-13 DIAGNOSIS — Z0000 Encounter for general adult medical examination without abnormal findings: Secondary | ICD-10-CM

## 2011-09-13 MED ORDER — ALLOPURINOL 100 MG PO TABS: 100.0000 mg | ORAL_TABLET | Freq: Every day | ORAL | Status: DC

## 2011-09-13 MED ORDER — EPINEPHRINE 0.3 MG/0.3ML IJ DEVI: 0.3000 mg | Freq: Once | INTRAMUSCULAR | Status: DC

## 2011-09-13 MED ORDER — INSULIN ASPART PROT & ASPART (70-30 MIX) 100 UNIT/ML ~~LOC~~ SUSP: SUBCUTANEOUS | Status: DC

## 2011-09-13 MED ORDER — AMMONIUM LACTATE 12 % EX LOTN: TOPICAL_LOTION | CUTANEOUS | Status: AC | PRN

## 2011-09-13 MED ORDER — INSULIN GLARGINE 100 UNIT/ML ~~LOC~~ SOLN: 120.0000 [IU] | Freq: Every day | SUBCUTANEOUS | Status: DC

## 2011-09-13 NOTE — Progress Notes (Signed)
  Subjective:    Patient ID: Bobby Mcfarland, male    DOB: 01/30/1956, 56 y.o.   MRN: [id number]  HPI 56 yr old male for a cpx. He is doing well with no concerns. His cellulitis has cleared up. He is swimming at the [facility] 3 days  Week. His am fasting glucoses range 140s to 150s most of the time. His recent labs show an A1c of 7.9. He is due to see Dr. Christella Hartigan again soon. He asks me to check a rash on his left hand that itches. He asks for a referral to Podiatry for his toenails.    Review of Systems  Constitutional: Negative.   HENT: Negative.   Eyes: Negative.   Respiratory: Negative.   Cardiovascular: Negative.   Gastrointestinal: Negative.   Genitourinary: Negative.   Musculoskeletal: Negative.   Skin: Negative.   Neurological: Negative.   Hematological: Negative.   Psychiatric/Behavioral: Negative.        Objective:   Physical Exam  Constitutional: He is oriented to person, place, and time. No distress.       Morbidly obese   HENT:  Head: Normocephalic and atraumatic.  Right Ear: External ear normal.  Left Ear: External ear normal.  Nose: Nose normal.  Mouth/Throat: Oropharynx is clear and moist. No oropharyngeal exudate.  Eyes: Conjunctivae and EOM are normal. Pupils are equal, round, and reactive to light. Right eye exhibits no discharge. Left eye exhibits no discharge. No scleral icterus.  Neck: Neck supple. No JVD present. No tracheal deviation present. No thyromegaly present.  Cardiovascular: Normal rate, regular rhythm, normal heart sounds and intact distal pulses.  Exam reveals no gallop and no friction rub.   No murmur heard.      EKG normal   Pulmonary/Chest: Effort normal and breath sounds normal. No respiratory distress. He has no wheezes. He has no rales. He exhibits no tenderness.  Abdominal: Soft. Bowel sounds are normal. He exhibits no distension and no mass. There is no tenderness. There is no rebound and no guarding.  Genitourinary: Rectum normal, prostate  normal and penis normal. Guaiac negative stool. No penile tenderness.  Musculoskeletal: Normal range of motion. He exhibits no edema and no tenderness.  Lymphadenopathy:    He has no cervical adenopathy.  Neurological: He is alert and oriented to person, place, and time. He has normal reflexes. No cranial nerve deficit. He exhibits normal muscle tone. Coordination normal.  Skin: Skin is warm and dry. No rash noted. He is not diaphoretic. No erythema. No pallor.  Psychiatric: He has a normal mood and affect. His behavior is normal. Judgment and thought content normal.          Assessment & Plan:  Well exam. We will increase his Lantus to 120 units. Keep other meds the same.

## 2011-09-14 ENCOUNTER — Telehealth: Payer: Self-pay | Admitting: *Deleted

## 2011-09-14 MED ORDER — DOXYCYCLINE HYCLATE 100 MG PO TABS: 100.0000 mg | ORAL_TABLET | Freq: Two times a day (BID) | ORAL | Status: AC

## 2011-09-14 NOTE — Telephone Encounter (Signed)
Notified pt. 

## 2011-09-14 NOTE — Telephone Encounter (Signed)
Pt. States after his CPX yesterday, he got home and realized he was developing a cellulitis of his left leg. Is asking if Dr. Clent Ridges will give him an antibiotic?

## 2011-09-14 NOTE — Telephone Encounter (Signed)
Call in Doxycyline 100 mg bid for 30 days with one refill

## 2011-09-19 ENCOUNTER — Encounter: Payer: Self-pay | Admitting: Gastroenterology

## 2011-09-19 ENCOUNTER — Ambulatory Visit (INDEPENDENT_AMBULATORY_CARE_PROVIDER_SITE_OTHER): Payer: PRIVATE HEALTH INSURANCE | Admitting: Gastroenterology

## 2011-09-19 VITALS — BP 116/70 | HR 88 | Ht 68.5 in | Wt 341.4 lb

## 2011-09-19 DIAGNOSIS — K7460 Unspecified cirrhosis of liver: Secondary | ICD-10-CM

## 2011-09-19 MED ORDER — NADOLOL 20 MG PO TABS: 20.0000 mg | ORAL_TABLET | Freq: Three times a day (TID) | ORAL | Status: DC

## 2011-09-19 MED ORDER — FUROSEMIDE 20 MG PO TABS: 20.0000 mg | ORAL_TABLET | Freq: Three times a day (TID) | ORAL | Status: DC

## 2011-09-19 MED ORDER — SPIRONOLACTONE 50 MG PO TABS: 100.0000 mg | ORAL_TABLET | Freq: Every day | ORAL | Status: DC

## 2011-09-19 MED ORDER — POTASSIUM CHLORIDE ER 10 MEQ PO TBCR: 10.0000 meq | EXTENDED_RELEASE_TABLET | Freq: Every day | ORAL | Status: DC

## 2011-09-19 NOTE — Progress Notes (Signed)
1. Tubular adenoma, colonoscopy Dr. Jarold Motto 06/1008; recall at 5 years.  2. Cirrhosis: diagnosed in 2011: Complicated by ascites, portal hypertension, esophageal and gastric varices that have never overtly bled, thrombocytopenia, elevated protime (INR 1.3). Laboratory workup March 2011: hepatitis A., B., C. negative. ANA negative. Normal ceruloplasmin. Alpha one antitrypsin level normal. Ferritin, other iron studies normal. AMA negative. ASMA negative.  Acites: Large-volume paracentesis March 2011; no bacterial peritonitis, + elevated SAAG.  On Lasix, Aldactone at relatively low doses (4/13) last imaging April, 2012 ultrasound: Cirrhosis, splenomegaly, cholelithiasis; no ascites, no masses in liver Last EGD March 2011: small gastric and esophageal varices, portal gastropathy, ulcer in duodenum, biopsies of stomach showed no H. pylori ( he was taking a lot of NSAIDs at the time). On Nadolol, increased to 40mg  a day in oct 2011.  last afp April 2013 normal started hep A/B immunizations in 10/2009 Low platelets chronically 60-80k range   HPI: This is a  very pleasant 56 year old man whom I last saw one year ago.  He has cirrhosis, likely from fatty liver. Ascites under good control on diuretics. He takes nadolol  daily.  Cellulitis left leg,  Started abx for it this past weekend, it is helping.  Working Clinical biochemist, on phones a lot.  No confusion.  No trouble with edema usually, left leg swollen with cellulitis.  Past Medical History  Diagnosis Date  . Hypertension   . Diabetes mellitus   . Hyperlipidemia   . Gout   . Morbidly obese   . Sleep apnea   . Eczema   . ED (erectile dysfunction)   . Osteoarthritis   . Pancytopenia   . Cirrhosis of liver     sees Dr. Wendall Papa   . Portal hypertension   . Esophageal and gastric varices   . Ascites     Past Surgical History  Procedure Date  . Tonsillectomy   . Incise and drain abcess     rt thigh 10-24-2008  . Paracentesis   .  Colonoscopy Feb. 2010    per Dr. Jarold Motto, tubular adenomas, repeat in 5 yrs     Current Outpatient Prescriptions  Medication Sig Dispense Refill  . allopurinol (ZYLOPRIM) 100 MG tablet Take 1 tablet (100 mg total) by mouth daily.  90 tablet  3  . ammonium lactate (LAC-HYDRIN) 12 % lotion Apply topically as needed for dry skin.  400 g  5  . B-D ULTRAFINE III SHORT PEN 31G X 8 MM MISC USE 3 TIMES DAILY AS DIRECTED **DX CODE 250.00**  300 each  5  . doxycycline (VIBRA-TABS) 100 MG tablet Take 1 tablet (100 mg total) by mouth 2 (two) times daily.  60 tablet  0  . EPINEPHrine (EPIPEN) 0.3 mg/0.3 mL DEVI Inject 0.3 mLs (0.3 mg total) into the skin once.  2 Device  11  . furosemide (LASIX) 20 MG tablet TAKE 3 TABLETS BY MOUTH DAILY  90 tablet  10  . insulin aspart protamine-insulin aspart (NOVOLOG MIX 70/30 FLEXPEN) (70-30) 100 UNIT/ML injection Take 30 units at breakfast, 40 units at lunch, and 50 units at supper  45 mL  11  . insulin glargine (LANTUS SOLOSTAR) 100 UNIT/ML injection Inject 120 Units into the skin at bedtime.  3 mL  11  . KLOR-CON 10 10 MEQ tablet TAKE 1 TABLET BY MOUTH EVERY DAY  30 tablet  11  . nadolol (CORGARD) 20 MG tablet TAKE 3 TABLETS BY MOUTH EVERY MORNING  90 tablet  11  .  spironolactone (ALDACTONE) 50 MG tablet TAKE 1 TABLET TWICE A DAY  60 tablet  2    Allergies as of 09/19/2011 - Review Complete 09/19/2011  Allergen Reaction Noted  . Bee venom Swelling and Rash 09/19/2011    Family History  Problem Relation Age of Onset  . Diabetes    . Dementia      grandmother    History   Social History  . Marital Status: Married    Spouse Name: N/A    Number of Children: N/A  . Years of Education: N/A   Occupational History  . Sales    Social History Main Topics  . Smoking status: Current Some Day Smoker    Types: Pipe, Cigars  . Smokeless tobacco: Never Used   Comment: 3 times a week  . Alcohol Use: No  . Drug Use: No  . Sexually Active: Not on file    Other Topics Concern  . Not on file   Social History Narrative  . No narrative on file      Physical Exam: BP 116/70  Pulse 88  Ht 5' 8.5" (1.74 m)  Wt 341 lb 6.4 oz (154.858 kg)  BMI 51.15 kg/m2 Constitutional: generally well-appearing Psychiatric: alert and oriented x3 Abdomen: soft, nontender, nondistended, no obvious ascites, no peritoneal signs, normal bowel sounds Right lower extremity normal, left lower extremity with erythema, significant edema, swollen. He says this is better since he has been on antibiotics over the past few days.    Assessment and plan: 56 y.o. male with cirrhosis  He needs hepatoma screening with ultrasound. He wants to wait until he gets back from vacation in mid to late June and that seems fine with me. Alpha-fetoprotein was normal. He needs refills on his s Aldactone l, Lasix, nadolol potassium supplement.  He knows to avoid high salt intake, NSAIDs, and alcohol. He should return to see me in about one year and sooner if needed.

## 2011-09-19 NOTE — Patient Instructions (Addendum)
You will be set up for an ultrasound for hepatoma screening (can wait until June per patient request).  We will call you to schedule in June It is important that you have a relatively low salt diet.  High salt diet can cause fluid to accumulate in your legs, abdomen and even around your lungs. You should try to avoid NSAID type over the counter pain medicines as best as possible. Tylenol is safe to take for 'routine' aches and pains, but never take more than 1/2 the dose suggested on the package instructions (never more than 2 grams per day). Avoid alcohol. Refills on lasix, nadolol, aldactone and potassium called in.

## 2011-10-17 ENCOUNTER — Other Ambulatory Visit: Payer: Self-pay | Admitting: Gastroenterology

## 2011-10-23 ENCOUNTER — Telehealth: Payer: Self-pay

## 2011-10-23 NOTE — Telephone Encounter (Signed)
Message copied by Donata Duff on Mon Oct 23, 2011  8:05 AM ------      Message from: Donata Duff      Created: Tue Sep 19, 2011  4:24 PM       Pt needs [place] see 09/19/11 note

## 2011-10-24 NOTE — Telephone Encounter (Signed)
Left message on machine to call back  

## 2011-10-25 NOTE — Telephone Encounter (Signed)
Left message on machine to call back letter mailed. 

## 2012-01-27 ENCOUNTER — Other Ambulatory Visit: Payer: Self-pay | Admitting: Family Medicine

## 2012-02-05 ENCOUNTER — Ambulatory Visit (INDEPENDENT_AMBULATORY_CARE_PROVIDER_SITE_OTHER): Payer: PRIVATE HEALTH INSURANCE | Admitting: Family Medicine

## 2012-02-05 ENCOUNTER — Encounter: Payer: Self-pay | Admitting: Family Medicine

## 2012-02-05 VITALS — BP 110/70 | HR 79 | Temp 98.9°F | Wt 350.0 lb

## 2012-02-05 DIAGNOSIS — L0390 Cellulitis, unspecified: Secondary | ICD-10-CM

## 2012-02-05 DIAGNOSIS — L0291 Cutaneous abscess, unspecified: Secondary | ICD-10-CM

## 2012-02-05 MED ORDER — DOXYCYCLINE HYCLATE 100 MG PO CAPS: 100.0000 mg | ORAL_CAPSULE | Freq: Two times a day (BID) | ORAL | Status: AC

## 2012-02-05 MED ORDER — FUROSEMIDE 20 MG PO TABS: 60.0000 mg | ORAL_TABLET | Freq: Every day | ORAL | Status: DC

## 2012-02-05 NOTE — Progress Notes (Signed)
  Subjective:    Patient ID: Bobby Mcfarland, male    DOB: Mar 22, 1956, 56 y.o.   MRN: [id number]  HPI Here for a recurrence of cellulitis in the left lower leg. This started 2 days ago. It has been red and painful, and he has had some fevers.    Review of Systems  Constitutional: Positive for fever.  Cardiovascular: Positive for leg swelling.  Skin: Positive for color change.       Objective:   Physical Exam  Constitutional: He appears well-developed and well-nourished.  Cardiovascular: Normal rate, regular rhythm, normal heart sounds and intact distal pulses.   Pulmonary/Chest: Effort normal and breath sounds normal.  Musculoskeletal:       Left lower leg is swollen,  red, warm, and slightly tender           Assessment & Plan:  Given Doxycycline for 30 days

## 2012-02-09 ENCOUNTER — Other Ambulatory Visit: Payer: Self-pay | Admitting: Gastroenterology

## 2012-07-22 ENCOUNTER — Telehealth: Payer: Self-pay | Admitting: Family Medicine

## 2012-07-22 NOTE — Telephone Encounter (Signed)
Refill request for Lantus inject 120 units qhs and needs 3 boxes 45 ml.

## 2012-07-23 MED ORDER — INSULIN GLARGINE 100 UNIT/ML ~~LOC~~ SOLN: 120.0000 [IU] | Freq: Every day | SUBCUTANEOUS | Status: DC

## 2012-07-23 NOTE — Telephone Encounter (Signed)
Please take care of this.  

## 2012-07-23 NOTE — Telephone Encounter (Signed)
Rx sent to pharmacy   

## 2012-08-17 ENCOUNTER — Other Ambulatory Visit: Payer: Self-pay | Admitting: Gastroenterology

## 2012-09-14 ENCOUNTER — Other Ambulatory Visit: Payer: Self-pay | Admitting: Gastroenterology

## 2012-09-14 ENCOUNTER — Other Ambulatory Visit: Payer: Self-pay | Admitting: Family Medicine

## 2012-09-16 MED ORDER — INSULIN PEN NEEDLE 31G X 8 MM MISC: Status: DC

## 2012-09-16 NOTE — Telephone Encounter (Signed)
Refill both for one year. 

## 2012-09-16 NOTE — Addendum Note (Signed)
Addended by: Aniceto Boss A on: 09/16/2012 03:28 PM   Modules accepted: Orders

## 2012-09-16 NOTE — Telephone Encounter (Signed)
Can we refill these? 

## 2012-09-20 ENCOUNTER — Other Ambulatory Visit: Payer: Self-pay | Admitting: Family Medicine

## 2012-12-15 ENCOUNTER — Other Ambulatory Visit: Payer: Self-pay | Admitting: Gastroenterology

## 2013-01-15 ENCOUNTER — Encounter: Payer: Self-pay | Admitting: Family Medicine

## 2013-01-15 ENCOUNTER — Ambulatory Visit (INDEPENDENT_AMBULATORY_CARE_PROVIDER_SITE_OTHER): Payer: PRIVATE HEALTH INSURANCE | Admitting: Family Medicine

## 2013-01-15 VITALS — BP 112/70 | HR 73 | Temp 98.3°F | Wt 345.0 lb

## 2013-01-15 DIAGNOSIS — L02419 Cutaneous abscess of limb, unspecified: Secondary | ICD-10-CM

## 2013-01-15 MED ORDER — EPINEPHRINE 0.3 MG/0.3ML IJ SOAJ: 0.3000 mg | Freq: Once | INTRAMUSCULAR | Status: DC

## 2013-01-15 MED ORDER — DOXYCYCLINE HYCLATE 100 MG PO CAPS: 100.0000 mg | ORAL_CAPSULE | Freq: Two times a day (BID) | ORAL | Status: AC

## 2013-01-15 NOTE — Progress Notes (Signed)
  Subjective:    Patient ID: UNNAMED ZEIEN, male    DOB: 1955/06/20, 57 y.o.   MRN: [id number]  HPI Here for another bout of cellulitis. He has done well for the past year, but 4 days ago he missed a step and fell onto his left knee. This knee was bruised a bit but this is healing well. However the left leg has become warm and tender and red since then.   Review of Systems  Constitutional: Negative.   Respiratory: Negative.   Cardiovascular: Positive for leg swelling. Negative for chest pain and palpitations.  Skin: Positive for color change.       Objective:   Physical Exam  Constitutional: He appears well-developed and well-nourished.  Cardiovascular: Normal rate, regular rhythm, normal heart sounds and intact distal pulses.   Pulmonary/Chest: Effort normal and breath sounds normal.  Musculoskeletal:  2+ edema to both legs. The left lower leg is warm and red and tender, no cords felt           Assessment & Plan:  This is the beginning of another bout of cellulitis. Start on Doxycycline for 30 days

## 2013-01-28 ENCOUNTER — Other Ambulatory Visit: Payer: Self-pay | Admitting: Family Medicine

## 2013-04-11 ENCOUNTER — Other Ambulatory Visit: Payer: Self-pay | Admitting: Gastroenterology

## 2013-05-13 ENCOUNTER — Telehealth: Payer: Self-pay | Admitting: Gastroenterology

## 2013-05-13 ENCOUNTER — Encounter: Payer: Self-pay | Admitting: *Deleted

## 2013-05-13 NOTE — Telephone Encounter (Signed)
Pt has had diarrhea and abd pain, would like to be seen before he goes out of town tomorrow.  Appt with Shanda Bumps tomorrow morning

## 2013-05-13 NOTE — Telephone Encounter (Signed)
Left message on machine to call back  

## 2013-05-14 ENCOUNTER — Telehealth: Payer: Self-pay | Admitting: *Deleted

## 2013-05-14 ENCOUNTER — Encounter: Payer: Self-pay | Admitting: Gastroenterology

## 2013-05-14 ENCOUNTER — Ambulatory Visit (INDEPENDENT_AMBULATORY_CARE_PROVIDER_SITE_OTHER): Payer: PRIVATE HEALTH INSURANCE | Admitting: Gastroenterology

## 2013-05-14 ENCOUNTER — Other Ambulatory Visit (INDEPENDENT_AMBULATORY_CARE_PROVIDER_SITE_OTHER): Payer: PRIVATE HEALTH INSURANCE

## 2013-05-14 VITALS — BP 102/60 | HR 72 | Ht 68.0 in | Wt 347.1 lb

## 2013-05-14 DIAGNOSIS — R197 Diarrhea, unspecified: Secondary | ICD-10-CM

## 2013-05-14 DIAGNOSIS — R109 Unspecified abdominal pain: Secondary | ICD-10-CM

## 2013-05-14 LAB — CBC WITH DIFFERENTIAL/PLATELET
Basophils Absolute: 0 K/uL (ref 0.0–0.1)
Basophils Relative: 0.1 % (ref 0.0–3.0)
Eosinophils Absolute: 0.2 K/uL (ref 0.0–0.7)
Eosinophils Relative: 1.8 % (ref 0.0–5.0)
HCT: 42.9 % (ref 39.0–52.0)
Hemoglobin: 14.7 g/dL (ref 13.0–17.0)
Lymphocytes Relative: 9.2 % — ABNORMAL LOW (ref 12.0–46.0)
Lymphs Abs: 0.9 K/uL (ref 0.7–4.0)
MCHC: 34.2 g/dL (ref 30.0–36.0)
MCV: 96.5 fl (ref 78.0–100.0)
Monocytes Absolute: 1.1 K/uL — ABNORMAL HIGH (ref 0.1–1.0)
Monocytes Relative: 11.8 % (ref 3.0–12.0)
Neutro Abs: 7.3 K/uL (ref 1.4–7.7)
Neutrophils Relative %: 77.1 % — ABNORMAL HIGH (ref 43.0–77.0)
Platelets: 92 K/uL — ABNORMAL LOW (ref 150.0–400.0)
RBC: 4.45 Mil/uL (ref 4.22–5.81)
RDW: 13.7 % (ref 11.5–14.6)
WBC: 9.5 K/uL (ref 4.5–10.5)

## 2013-05-14 LAB — COMPREHENSIVE METABOLIC PANEL WITH GFR
ALT: 34 U/L (ref 0–53)
AST: 41 U/L — ABNORMAL HIGH (ref 0–37)
Albumin: 3.5 g/dL (ref 3.5–5.2)
Alkaline Phosphatase: 89 U/L (ref 39–117)
BUN: 20 mg/dL (ref 6–23)
CO2: 27 meq/L (ref 19–32)
Calcium: 8.9 mg/dL (ref 8.4–10.5)
Chloride: 101 meq/L (ref 96–112)
Creatinine, Ser: 0.9 mg/dL (ref 0.4–1.5)
GFR: 87.61 mL/min (ref >60.00)
Glucose, Bld: 163 mg/dL — ABNORMAL HIGH (ref 70–99)
Potassium: 4 meq/L (ref 3.5–5.1)
Sodium: 136 meq/L (ref 135–145)
Total Bilirubin: 1.8 mg/dL — ABNORMAL HIGH (ref 0.3–1.2)
Total Protein: 8.3 g/dL (ref 6.0–8.3)

## 2013-05-14 MED ORDER — HYOSCYAMINE SULFATE 0.125 MG SL SUBL: SUBLINGUAL_TABLET | SUBLINGUAL | Status: DC

## 2013-05-14 NOTE — Progress Notes (Signed)
05/14/2013 Bobby Mcfarland [id number] Apr 11, 1956   History of Present Illness:  This is a pleasant 57 year old male who is known to Dr. Christella Hartigan for management of his cirrhosis.  Comes in today with complaints of acute onset abdominal cramping and diarrhea on Sunday evening into Monday.  Diarrhea has subsided but still has some loose stools.  Stools are normal color; no black or bloody stools.  Cramping has not occurred this AM, but is concerned because he is going out of town.  No nausea or vomiting.  His wife had a similar illness last week.   Current Medications, Allergies, Past Medical History, Past Surgical History, Family History and Social History were reviewed in [employer] record.   Physical Exam: BP 102/60  Pulse 72  Ht 5\' 8"  (1.727 m)  Wt 347 lb 2 oz (157.455 kg)  BMI 52.79 kg/m2 General: Well developed, white male in no acute distress Head: Normocephalic and atraumatic Eyes:  Sclerae anicteric, conjunctiva pink  Ears: Normal auditory acuity Lungs: Clear throughout to auscultation Heart: Regular rate and rhythm Abdomen: Soft, obese.  Non-distended.  BS present.  Non-tender. Musculoskeletal: Symmetrical with no gross deformities  Extremities: 2+ pitting edema in B/L LE's.  Neurological: Alert oriented x 4, grossly non-focal Psychological:  Alert and cooperative. Normal mood and affect.  Assessment and Recommendations: -Acute diarrhea and abdominal cramping:  Likely infectious and resolving.  Will check labs including CBC and CMP.  Will give him levsin to use prn for abdominal cramping if this recurs. -Cirrhosis:  Has follow-up with Dr. Christella Hartigan in January for his chronic problems.

## 2013-05-14 NOTE — Telephone Encounter (Signed)
I went over the lab results from today with Doug Sou PA-C.  She had me call the patient and let him know his labs are good and stable.  The patient thanked me for calling.

## 2013-05-14 NOTE — Patient Instructions (Signed)
Please go to the basement level to have your labs drawn.   We sent a prescription for Levisin for cramps and spasms to [facility] [address]

## 2013-05-16 NOTE — Progress Notes (Signed)
i agree with the plan in this note 

## 2013-05-30 ENCOUNTER — Other Ambulatory Visit: Payer: Self-pay | Admitting: Family Medicine

## 2013-06-02 ENCOUNTER — Other Ambulatory Visit: Payer: Self-pay | Admitting: Gastroenterology

## 2013-06-17 ENCOUNTER — Other Ambulatory Visit: Payer: PRIVATE HEALTH INSURANCE

## 2013-06-17 ENCOUNTER — Ambulatory Visit (INDEPENDENT_AMBULATORY_CARE_PROVIDER_SITE_OTHER): Payer: PRIVATE HEALTH INSURANCE | Admitting: Gastroenterology

## 2013-06-17 ENCOUNTER — Other Ambulatory Visit (INDEPENDENT_AMBULATORY_CARE_PROVIDER_SITE_OTHER): Payer: PRIVATE HEALTH INSURANCE

## 2013-06-17 ENCOUNTER — Encounter: Payer: Self-pay | Admitting: Gastroenterology

## 2013-06-17 ENCOUNTER — Ambulatory Visit: Payer: PRIVATE HEALTH INSURANCE | Admitting: Gastroenterology

## 2013-06-17 VITALS — BP 110/60 | HR 80 | Ht 68.0 in | Wt 349.1 lb

## 2013-06-17 DIAGNOSIS — Z86010 Personal history of colonic polyps: Secondary | ICD-10-CM

## 2013-06-17 DIAGNOSIS — K7460 Unspecified cirrhosis of liver: Secondary | ICD-10-CM

## 2013-06-17 LAB — CBC WITH DIFFERENTIAL/PLATELET
Basophils Absolute: 0 10*3/uL (ref 0.0–0.1)
Basophils Relative: 0.5 % (ref 0.0–3.0)
EOS PCT: 3.1 % (ref 0.0–5.0)
Eosinophils Absolute: 0.2 10*3/uL (ref 0.0–0.7)
HEMATOCRIT: 40.3 % (ref 39.0–52.0)
HEMOGLOBIN: 14 g/dL (ref 13.0–17.0)
LYMPHS ABS: 0.8 10*3/uL (ref 0.7–4.0)
Lymphocytes Relative: 12.7 % (ref 12.0–46.0)
MCHC: 34.7 g/dL (ref 30.0–36.0)
MCV: 96.3 fl (ref 78.0–100.0)
Monocytes Absolute: 0.7 10*3/uL (ref 0.1–1.0)
Monocytes Relative: 10.9 % (ref 3.0–12.0)
NEUTROS ABS: 4.7 10*3/uL (ref 1.4–7.7)
Neutrophils Relative %: 72.8 % (ref 43.0–77.0)
Platelets: 63 10*3/uL — ABNORMAL LOW (ref 150.0–400.0)
RBC: 4.19 Mil/uL — ABNORMAL LOW (ref 4.22–5.81)
RDW: 14.3 % (ref 11.5–14.6)
WBC: 6.4 10*3/uL (ref 4.5–10.5)

## 2013-06-17 LAB — COMPREHENSIVE METABOLIC PANEL
ALT: 38 U/L (ref 0–53)
AST: 51 U/L — ABNORMAL HIGH (ref 0–37)
Albumin: 3.2 g/dL — ABNORMAL LOW (ref 3.5–5.2)
Alkaline Phosphatase: 112 U/L (ref 39–117)
BILIRUBIN TOTAL: 1.1 mg/dL (ref 0.3–1.2)
BUN: 17 mg/dL (ref 6–23)
CO2: 27 meq/L (ref 19–32)
CREATININE: 1 mg/dL (ref 0.4–1.5)
Calcium: 8.7 mg/dL (ref 8.4–10.5)
Chloride: 103 mEq/L (ref 96–112)
GFR: 86.52 mL/min (ref >60.00)
GLUCOSE: 223 mg/dL — AB (ref 70–99)
Potassium: 3.9 mEq/L (ref 3.5–5.1)
Sodium: 136 mEq/L (ref 135–145)
TOTAL PROTEIN: 8 g/dL (ref 6.0–8.3)

## 2013-06-17 LAB — PROTIME-INR
INR: 1.3 ratio — ABNORMAL HIGH (ref 0.8–1.0)
Prothrombin Time: 13.5 s — ABNORMAL HIGH (ref 10.2–12.4)

## 2013-06-17 MED ORDER — NADOLOL 20 MG PO TABS: 60.0000 mg | ORAL_TABLET | Freq: Every day | ORAL | Status: DC

## 2013-06-17 MED ORDER — POTASSIUM CHLORIDE ER 10 MEQ PO TBCR: EXTENDED_RELEASE_TABLET | ORAL | Status: DC

## 2013-06-17 MED ORDER — MOVIPREP 100 G PO SOLR: 1.0000 | Freq: Once | ORAL | Status: DC

## 2013-06-17 NOTE — Progress Notes (Signed)
1. Tubular adenoma, colonoscopy Dr. Sharlett Iles 06/1008; recall at 5 years.  2. Cirrhosis: diagnosed in 1610: Complicated by ascites, portal hypertension, esophageal and gastric varices that have never overtly bled, thrombocytopenia, elevated protime (INR 1.3). Laboratory workup March 2011: hepatitis A., B., C. negative. ANA negative. Normal ceruloplasmin. Alpha one antitrypsin level normal. Ferritin, other iron studies normal. AMA negative. ASMA negative.  Acites: Large-volume paracentesis March 2011; no bacterial peritonitis, + elevated SAAG. On Lasix, Aldactone at relatively low doses (4/13)  last imaging April, 2012 ultrasound: Cirrhosis, splenomegaly, cholelithiasis; no ascites, no masses in liver  Last EGD March 2011: small gastric and esophageal varices, portal gastropathy, ulcer in duodenum, biopsies of stomach showed no H. pylori ( he was taking a lot of NSAIDs at the time). On Nadolol, increased to 40mg  a day in oct 2011.  last afp April 2013 normal  started hep A/B immunizations in 10/2009  Low platelets chronically 60-80k range    HPI: This is a  very pleasant 58 year old man whom I last saw about 2 years ago.  I last saw him April 2013. He did not get the hepatoma screening ultrasound which I recommended. I have asked him to come back at yearly intervals however have not seen him in about 2 years. He was here in the office last month with a acute diarrheal illness that had artery resolved by the time he was in the office.   Weight is up 8 pounds since last office visit here about 2 years ago.  The diarrheal illness from a month ago is gone.  Takes TUMS periodically.  Helps with indigestion.  Pepcid AC also helps.  Past Medical History  Diagnosis Date  . Hypertension   . Diabetes mellitus   . Hyperlipidemia   . Gout   . Morbidly obese   . Sleep apnea   . Eczema   . ED (erectile dysfunction)   . Osteoarthritis   . Pancytopenia   . Cirrhosis of liver     sees Dr. Oretha Caprice    . Portal hypertension   . Esophageal and gastric varices   . Ascites   . colon polyps 07/08/2008    Tubular adenoma, hyperplastic    Past Surgical History  Procedure Laterality Date  . Tonsillectomy    . Incise and drain abcess      rt thigh 10-24-2008  . Paracentesis    . Colonoscopy  Feb. 2010    per Dr. Sharlett Iles, tubular adenomas, repeat in 5 yrs     Current Outpatient Prescriptions  Medication Sig Dispense Refill  . allopurinol (ZYLOPRIM) 100 MG tablet Take 1 tablet (100 mg total) by mouth daily.  90 tablet  3  . EPINEPHrine (EPIPEN 2-PAK) 0.3 mg/0.3 mL SOAJ injection Inject 0.3 mLs (0.3 mg total) into the muscle once.  1 Device  11  . furosemide (LASIX) 20 MG tablet Take 3 tablets (60 mg total) by mouth daily.  30 tablet  0  . hyoscyamine (LEVSIN/SL) 0.125 MG SL tablet Take 1 tab on the tongue to dissolve every 6 hours as needed for cramps and spasms.  15 tablet  0  . insulin aspart protamine-insulin aspart (NOVOLOG MIX 70/30 FLEXPEN) (70-30) 100 UNIT/ML injection Take 30 units at breakfast, 40 units at lunch, and 50 units at supper  45 mL  11  . Insulin Pen Needle (B-D ULTRAFINE III SHORT PEN) 31G X 8 MM MISC Use 4 times per day  300 each  3  . KLOR-CON 10 10 MEQ  tablet TAKE 1 TABLET BY MOUTH EVERY DAY  30 tablet  11  . LANTUS SOLOSTAR 100 UNIT/ML Solostar Pen INJECT 120 UNITS INTO THE SKIN AT BEDTIME.  45 mL  3  . nadolol (CORGARD) 20 MG tablet Take 60 mg by mouth daily.      Marland Kitchen spironolactone (ALDACTONE) 50 MG tablet TAKE 1 TABLET TWICE A DAY  60 tablet  0   No current facility-administered medications for this visit.    Allergies as of 06/17/2013 - Review Complete 06/17/2013  Allergen Reaction Noted  . Bee venom Swelling and Rash 09/19/2011    Family History  Problem Relation Age of Onset  . Diabetes    . Dementia      grandmother    History   Social History  . Marital Status: Married    Spouse Name: N/A    Number of Children: N/A  . Years of Education: N/A    Occupational History  . Sales    Social History Main Topics  . Smoking status: Current Some Day Smoker    Types: Cigars  . Smokeless tobacco: Never Used     Comment: 3 times a week or less  . Alcohol Use: No  . Drug Use: No  . Sexual Activity: Not on file   Other Topics Concern  . Not on file   Social History Narrative  . No narrative on file      Physical Exam: Ht 5\' 8"  (1.727 m) Constitutional: Morbidly obese Psychiatric: alert and oriented x3 Abdomen: soft, nontender, nondistended, no obvious ascites, no peritoneal signs, normal bowel sounds Left lower extremity ankle with 1-2+ pitting edema and some erythema distally, right ankle with 1+ pitting edema    Assessment and plan: 58 y.o. male with cirrhosis, personal history of adenomatous colon polyps  First he needs colonoscopy for polyp surveillance and we will arrange for this to be done at his soonest convenience. Second he has more edema in his ankles than usual, and plan to adjust his diuretics upward likely by increasing his Aldactone to 150mg  daily but I want him to have blood tests first. These will include alpha-fetoprotein, complete metabolic profile, coags, CBC. He has not had hepatoma screening with imaging in about 2 years and I will order that for him now. He has some indigestion that is very easy control for him with either Pepcid or TUMS I recommended he continue taking Pepcid, probably daily around breakfast since his symptoms are usually in the late morning early afternoon. I have refilled his nadolol as well as his potassium today. Plan to refill his diuretics based on new dosing.

## 2013-06-17 NOTE — Patient Instructions (Addendum)
You have been given a separate informational sheet regarding your tobacco use, the importance of quitting and local resources to help you quit. You will be set up for an ultrasound for hepatoma screning. You have been scheduled for an abdominal ultrasound at [facility] (1st floor of hospital) on 06/19/13 at 230 pm. Please arrive 15 minutes prior to your appointment for registration. Make certain not to have anything to eat or drink 6 hours prior to your appointment. Should you need to reschedule your appointment, please contact radiology at [phone]. This test typically takes about 30 minutes to perform. You will have labs checked today in the basement lab.  Please head down after you check out with the front desk  (AFP, INR, CBC, CMET). Will adjust you diuretics upward based on bloodwork today. You will be set up for a colonoscopy for polyp surveillance.  Please call our office after you check your schedule so that we can set this up for you at [facility]. Start taking pepcid daily, in morning with BF meal. It is important that you have a relatively low salt diet.  High salt diet can cause fluid to accumulate in your legs, abdomen and even around your lungs. You should try to avoid NSAID type over the counter pain medicines as best as possible. Tylenol is safe to take for 'routine' aches and pains, but never take more than 1/2 the dose suggested on the package instructions (never more than 2 grams per day). Avoid alcohol. Refills of nadolol and potassium given today, will give new script for lasix and aldactone after seeing labs and deciding on new doses.

## 2013-06-18 LAB — AFP TUMOR MARKER: AFP-Tumor Marker: 2.3 ng/mL (ref 0.0–8.0)

## 2013-06-19 ENCOUNTER — Ambulatory Visit (HOSPITAL_COMMUNITY): Payer: PRIVATE HEALTH INSURANCE

## 2013-06-23 ENCOUNTER — Other Ambulatory Visit: Payer: Self-pay

## 2013-06-23 DIAGNOSIS — K7460 Unspecified cirrhosis of liver: Secondary | ICD-10-CM

## 2013-06-23 MED ORDER — FUROSEMIDE 20 MG PO TABS: 60.0000 mg | ORAL_TABLET | Freq: Every day | ORAL | Status: DC

## 2013-06-23 MED ORDER — SPIRONOLACTONE 50 MG PO TABS: ORAL_TABLET | ORAL | Status: DC

## 2013-06-30 ENCOUNTER — Ambulatory Visit (HOSPITAL_COMMUNITY)
Admission: RE | Admit: 2013-06-30 | Discharge: 2013-06-30 | Disposition: A | Discharge status: Home / Self Care | Payer: PRIVATE HEALTH INSURANCE | Source: Ambulatory Visit | Attending: Gastroenterology | Admitting: Gastroenterology

## 2013-06-30 DIAGNOSIS — K7460 Unspecified cirrhosis of liver: Secondary | ICD-10-CM

## 2013-06-30 DIAGNOSIS — E119 Type 2 diabetes mellitus without complications: Secondary | ICD-10-CM | POA: Insufficient documentation

## 2013-06-30 DIAGNOSIS — I10 Essential (primary) hypertension: Secondary | ICD-10-CM | POA: Insufficient documentation

## 2013-06-30 DIAGNOSIS — R161 Splenomegaly, not elsewhere classified: Secondary | ICD-10-CM | POA: Insufficient documentation

## 2013-07-07 ENCOUNTER — Telehealth: Payer: Self-pay

## 2013-07-07 NOTE — Telephone Encounter (Signed)
Message left with pt that it is time to get labs

## 2013-07-07 NOTE — Telephone Encounter (Signed)
Message copied by Barron Alvine on Mon Jul 07, 2013  8:16 AM ------      Message from: Barron Alvine      Created: Mon Jun 23, 2013  2:17 PM       Pt to get labs  ------

## 2013-07-07 NOTE — Telephone Encounter (Signed)
Lab reminder 

## 2013-07-11 ENCOUNTER — Telehealth: Payer: Self-pay | Admitting: Gastroenterology

## 2013-07-11 NOTE — Telephone Encounter (Signed)
Pt will call back to schedule colon at [facility] when he has his work schedule

## 2013-07-25 ENCOUNTER — Encounter: Payer: PRIVATE HEALTH INSURANCE | Admitting: Gastroenterology

## 2013-08-07 ENCOUNTER — Other Ambulatory Visit (INDEPENDENT_AMBULATORY_CARE_PROVIDER_SITE_OTHER): Payer: PRIVATE HEALTH INSURANCE

## 2013-08-07 DIAGNOSIS — K7460 Unspecified cirrhosis of liver: Secondary | ICD-10-CM

## 2013-08-07 LAB — BASIC METABOLIC PANEL
BUN: 22 mg/dL (ref 6–23)
CHLORIDE: 102 meq/L (ref 96–112)
CO2: 24 mEq/L (ref 19–32)
Calcium: 8.5 mg/dL (ref 8.4–10.5)
Creatinine, Ser: 0.9 mg/dL (ref 0.4–1.5)
GFR: 88.63 mL/min (ref >60.00)
Glucose, Bld: 196 mg/dL — ABNORMAL HIGH (ref 70–99)
POTASSIUM: 4.1 meq/L (ref 3.5–5.1)
SODIUM: 136 meq/L (ref 135–145)

## 2013-08-22 ENCOUNTER — Other Ambulatory Visit: Payer: Self-pay | Admitting: Gastroenterology

## 2013-09-06 ENCOUNTER — Other Ambulatory Visit: Payer: Self-pay | Admitting: Family Medicine

## 2013-09-06 ENCOUNTER — Other Ambulatory Visit: Payer: Self-pay | Admitting: Gastroenterology

## 2013-09-17 ENCOUNTER — Other Ambulatory Visit (INDEPENDENT_AMBULATORY_CARE_PROVIDER_SITE_OTHER): Payer: PRIVATE HEALTH INSURANCE

## 2013-09-17 DIAGNOSIS — Z0000 Encounter for general adult medical examination without abnormal findings: Secondary | ICD-10-CM

## 2013-09-17 LAB — BASIC METABOLIC PANEL
BUN: 20 mg/dL (ref 6–23)
CO2: 26 mEq/L (ref 19–32)
Calcium: 9.1 mg/dL (ref 8.4–10.5)
Chloride: 100 mEq/L (ref 96–112)
Creatinine, Ser: 0.9 mg/dL (ref 0.4–1.5)
GFR: 95.68 mL/min (ref >60.00)
Glucose, Bld: 170 mg/dL — ABNORMAL HIGH (ref 70–99)
POTASSIUM: 4.3 meq/L (ref 3.5–5.1)
SODIUM: 133 meq/L — AB (ref 135–145)

## 2013-09-17 LAB — POCT URINALYSIS DIPSTICK
Bilirubin, UA: NEGATIVE
Blood, UA: NEGATIVE
Glucose, UA: NEGATIVE
Ketones, UA: NEGATIVE
Leukocytes, UA: NEGATIVE
Nitrite, UA: NEGATIVE
Protein, UA: NEGATIVE
SPEC GRAV UA: 1.01
UROBILINOGEN UA: 0.2
pH, UA: 6

## 2013-09-17 LAB — CBC WITH DIFFERENTIAL/PLATELET
Basophils Absolute: 0 10*3/uL (ref 0.0–0.1)
Basophils Relative: 0.5 % (ref 0.0–3.0)
Eosinophils Absolute: 0.2 10*3/uL (ref 0.0–0.7)
Eosinophils Relative: 2.6 % (ref 0.0–5.0)
HEMATOCRIT: 43 % (ref 39.0–52.0)
HEMOGLOBIN: 14.8 g/dL (ref 13.0–17.0)
LYMPHS ABS: 1 10*3/uL (ref 0.7–4.0)
Lymphocytes Relative: 12 % (ref 12.0–46.0)
MCHC: 34.3 g/dL (ref 30.0–36.0)
MCV: 98.9 fl (ref 78.0–100.0)
MONOS PCT: 10.3 % (ref 3.0–12.0)
Monocytes Absolute: 0.9 10*3/uL (ref 0.1–1.0)
NEUTROS ABS: 6.3 10*3/uL (ref 1.4–7.7)
Neutrophils Relative %: 74.6 % (ref 43.0–77.0)
PLATELETS: 85 10*3/uL — AB (ref 150.0–400.0)
RBC: 4.35 Mil/uL (ref 4.22–5.81)
RDW: 14.7 % — ABNORMAL HIGH (ref 11.5–14.6)
WBC: 8.4 10*3/uL (ref 4.5–10.5)

## 2013-09-17 LAB — LIPID PANEL
CHOLESTEROL: 151 mg/dL (ref 0–200)
HDL: 47.1 mg/dL (ref >39.00)
LDL CALC: 85 mg/dL (ref 0–99)
Total CHOL/HDL Ratio: 3
Triglycerides: 93 mg/dL (ref 0.0–149.0)
VLDL: 18.6 mg/dL (ref 0.0–40.0)

## 2013-09-17 LAB — HEPATIC FUNCTION PANEL
ALBUMIN: 3.5 g/dL (ref 3.5–5.2)
ALK PHOS: 108 U/L (ref 39–117)
ALT: 46 U/L (ref 0–53)
AST: 51 U/L — AB (ref 0–37)
Bilirubin, Direct: 0.3 mg/dL (ref 0.0–0.3)
Total Bilirubin: 1.2 mg/dL (ref 0.3–1.2)
Total Protein: 8.3 g/dL (ref 6.0–8.3)

## 2013-09-17 LAB — HEMOGLOBIN A1C: Hgb A1c MFr Bld: 7.1 % — ABNORMAL HIGH (ref 4.6–6.5)

## 2013-09-17 LAB — MICROALBUMIN / CREATININE URINE RATIO
Creatinine,U: 11.8 mg/dL
Microalb Creat Ratio: 1.7 mg/g (ref 0.0–30.0)
Microalb, Ur: 0.2 mg/dL (ref 0.0–1.9)

## 2013-09-17 LAB — TSH: TSH: 3.02 u[IU]/mL (ref 0.35–5.50)

## 2013-09-17 LAB — PSA: PSA: 0.11 ng/mL (ref 0.10–4.00)

## 2013-09-24 ENCOUNTER — Encounter: Payer: Self-pay | Admitting: Family Medicine

## 2013-09-24 ENCOUNTER — Telehealth: Payer: Self-pay | Admitting: Family Medicine

## 2013-09-24 ENCOUNTER — Ambulatory Visit (INDEPENDENT_AMBULATORY_CARE_PROVIDER_SITE_OTHER): Payer: PRIVATE HEALTH INSURANCE | Admitting: Family Medicine

## 2013-09-24 VITALS — BP 98/60 | Temp 98.9°F | Ht 68.0 in | Wt 344.0 lb

## 2013-09-24 DIAGNOSIS — E785 Hyperlipidemia, unspecified: Secondary | ICD-10-CM

## 2013-09-24 DIAGNOSIS — Z0000 Encounter for general adult medical examination without abnormal findings: Secondary | ICD-10-CM

## 2013-09-24 MED ORDER — EPINEPHRINE 0.3 MG/0.3ML IJ SOAJ: 0.3000 mg | Freq: Once | INTRAMUSCULAR | Status: AC

## 2013-09-24 MED ORDER — INSULIN PEN NEEDLE 31G X 8 MM MISC: Status: AC

## 2013-09-24 MED ORDER — INSULIN ASPART PROT & ASPART (70-30 MIX) 100 UNIT/ML ~~LOC~~ SUSP: SUBCUTANEOUS | Status: DC

## 2013-09-24 MED ORDER — TRIAMCINOLONE ACETONIDE 0.1 % EX CREA: 1.0000 "application " | TOPICAL_CREAM | Freq: Three times a day (TID) | CUTANEOUS | Status: AC

## 2013-09-24 MED ORDER — ALLOPURINOL 100 MG PO TABS: 100.0000 mg | ORAL_TABLET | Freq: Every day | ORAL | Status: AC

## 2013-09-24 NOTE — Progress Notes (Signed)
   Subjective:    Patient ID: Bobby Mcfarland, male    DOB: 11-Mar-1956, 58 y.o.   MRN: [id number]  HPI 58 yr old male for a cpx. He feels well and has no complaints. He was having some left knee pain but after he got a steroid injection per Dr. Theda Sers in February, this has fel much better.   Review of Systems  Constitutional: Negative.   HENT: Negative.   Eyes: Negative.   Respiratory: Negative.   Cardiovascular: Negative.   Gastrointestinal: Negative.   Genitourinary: Negative.   Musculoskeletal: Negative.   Skin: Negative.   Neurological: Negative.   Psychiatric/Behavioral: Negative.        Objective:   Physical Exam  Constitutional: He is oriented to person, place, and time. He appears well-developed and well-nourished. No distress.  HENT:  Head: Normocephalic and atraumatic.  Right Ear: External ear normal.  Left Ear: External ear normal.  Nose: Nose normal.  Mouth/Throat: Oropharynx is clear and moist. No oropharyngeal exudate.  Eyes: Conjunctivae and EOM are normal. Pupils are equal, round, and reactive to light. Right eye exhibits no discharge. Left eye exhibits no discharge. No scleral icterus.  Neck: Neck supple. No JVD present. No tracheal deviation present. No thyromegaly present.  Cardiovascular: Normal rate, regular rhythm, normal heart sounds and intact distal pulses.  Exam reveals no gallop and no friction rub.   No murmur heard. EKG normal   Pulmonary/Chest: Effort normal and breath sounds normal. No respiratory distress. He has no wheezes. He has no rales. He exhibits no tenderness.  Abdominal: Soft. Bowel sounds are normal. He exhibits no distension and no mass. There is no tenderness. There is no rebound and no guarding.  Genitourinary: Rectum normal, prostate normal and penis normal. Guaiac negative stool. No penile tenderness.  Musculoskeletal: Normal range of motion. He exhibits no edema and no tenderness.  Lymphadenopathy:    He has no cervical  adenopathy.  Neurological: He is alert and oriented to person, place, and time. He has normal reflexes. No cranial nerve deficit. He exhibits normal muscle tone. Coordination normal.  Skin: Skin is warm and dry. No rash noted. He is not diaphoretic. No erythema. No pallor.  Psychiatric: He has a normal mood and affect. His behavior is normal. Judgment and thought content normal.          Assessment & Plan:  Well exam. He plans to have another colonoscopy per Dr. Ardis Hughs later this year.

## 2013-09-24 NOTE — Telephone Encounter (Signed)
Relevant patient education assigned to patient using Emmi. ° °

## 2013-09-24 NOTE — Progress Notes (Signed)
Pre visit review using our clinic review tool, if applicable. No additional management support is needed unless otherwise documented below in the visit note. 

## 2013-09-27 ENCOUNTER — Other Ambulatory Visit: Payer: Self-pay | Admitting: Family Medicine

## 2013-11-24 ENCOUNTER — Telehealth: Payer: Self-pay | Admitting: Family Medicine

## 2013-11-24 DIAGNOSIS — G4733 Obstructive sleep apnea (adult) (pediatric): Secondary | ICD-10-CM

## 2013-11-24 NOTE — Telephone Encounter (Signed)
I spoke with pt's spouse and went over below information. 

## 2013-11-24 NOTE — Telephone Encounter (Signed)
I am not qualified to order these supplies. He needs to see a sleep specialist to check his settings and make sure everything is okay. I will put in a stat referral so he can see Pulmonary very quickly to get this taken care of.
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
## 2013-11-26 ENCOUNTER — Encounter: Payer: Self-pay | Admitting: Internal Medicine

## 2013-11-26 ENCOUNTER — Ambulatory Visit (INDEPENDENT_AMBULATORY_CARE_PROVIDER_SITE_OTHER): Payer: PRIVATE HEALTH INSURANCE | Admitting: Internal Medicine

## 2013-11-26 VITALS — BP 128/80 | HR 72 | Ht 69.0 in | Wt 350.0 lb

## 2013-11-26 DIAGNOSIS — G4730 Sleep apnea, unspecified: Principal | ICD-10-CM

## 2013-11-26 DIAGNOSIS — G4733 Obstructive sleep apnea (adult) (pediatric): Secondary | ICD-10-CM

## 2013-11-26 DIAGNOSIS — G4710 Hypersomnia, unspecified: Secondary | ICD-10-CM

## 2013-11-26 NOTE — Patient Instructions (Signed)
Order- schedule split protocol NPSG    Dx OSA 
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
## 2013-11-30 NOTE — Assessment & Plan Note (Signed)
Reminded interaction between obesity and sleep apnea

## 2013-11-30 NOTE — Assessment & Plan Note (Signed)
He has been very compliant with CPAP for many years and does not want to consider being off of it so he is using a very old backup machine. He will need new sleep study to update documentation since last study is more than 59 years old and unavailable Plan-schedule sleep study. Reviewed basics including importance of weight control and responsibility to drive safely.

## 2014-01-02 ENCOUNTER — Other Ambulatory Visit: Payer: Self-pay | Admitting: Internal Medicine

## 2014-01-02 ENCOUNTER — Telehealth: Payer: Self-pay | Admitting: Internal Medicine

## 2014-01-02 DIAGNOSIS — G4733 Obstructive sleep apnea (adult) (pediatric): Secondary | ICD-10-CM

## 2014-01-02 NOTE — Telephone Encounter (Signed)
Courtesy Fax received from [place] from [place], [place] stating that Patient does not meet guideline for [phone] or 95810 due to No CoMorbid Conditions, but does meet of an HST. Form completed and faxed back to Mayra at [facility] (fax # [phone]) Phone number [phone] ext [phone] to allow [place] to set up HST for patient. Per Mayra at [facility] pre cert required for HST.  Waiting on patient to return my call to schedule HST. [name]

## 2014-01-02 NOTE — Telephone Encounter (Signed)
Called and spoke with Leda Gauze at [place] [phone] ext [phone]) She advised that the sleep lab had cancelled their authorization and for me to complete another form and fax to [employer] requesting HST to be done at [facility] on [address]. I completed form and waiting on authorization for [place] to arrange HST. [name]

## 2014-01-02 NOTE — Telephone Encounter (Signed)
Called and spoke with Mayra, LPN at [place] and she stated that since we didn't respond back by one business day that [facility] chose [facility] to provide this service and they were given the authorization to set up HST. Mayra stated that since authorization has been already issued to them, I would need to call the sleep lab and get the sleep lab to relinquish their authorization. Called Terri at the sleep lab, she located the authorization and is calling Mayra at [place] to relinquish [facility]'s authorization number.  Next step would be for [place] to complete another form and submit to [facility] requesting authorization for [place] to arrange the HST. [name]

## 2014-01-02 NOTE — Telephone Encounter (Signed)
Per Ardeen Fillers with [facility] that pt does not meet criteria for in lab study, but patient meets criteria for HST. Called and spoke with patient and advised him of the above. Pt was at work and will return my call to set up HST device. In lab study for 01/14/14 has been cancelled.  Waiting on patient to return my call. [name]

## 2014-01-06 NOTE — Telephone Encounter (Signed)
Rhonda please advise if ok to close this message.  thanks

## 2014-01-06 NOTE — Telephone Encounter (Signed)
Called and spoke with [facility]. No approval yet, advised to allow 1-2 business days.  Will need once approved, to contact patient and arrange HST. [name]

## 2014-01-09 NOTE — Telephone Encounter (Signed)
Called and spoke with Bobby Mcfarland at [place]. No authorization number yet. Pt is due to pick up HST on Monday 01/12/14. Will contact [employer] back on Monday morning if approval hasn't been faxed. [name]

## 2014-01-12 DIAGNOSIS — G4730 Sleep apnea, unspecified: Secondary | ICD-10-CM

## 2014-01-12 DIAGNOSIS — G4710 Hypersomnia, unspecified: Secondary | ICD-10-CM

## 2014-01-13 ENCOUNTER — Other Ambulatory Visit: Payer: Self-pay | Admitting: Internal Medicine

## 2014-01-13 DIAGNOSIS — G4733 Obstructive sleep apnea (adult) (pediatric): Secondary | ICD-10-CM

## 2014-01-13 NOTE — Telephone Encounter (Signed)
HST approved by [facility] and pt p/u HST and completed test on 01/12/14. Report given to Dr. Annamaria Boots to read. [name]

## 2014-01-14 ENCOUNTER — Encounter: Payer: Self-pay | Admitting: Internal Medicine

## 2014-01-14 ENCOUNTER — Encounter (HOSPITAL_BASED_OUTPATIENT_CLINIC_OR_DEPARTMENT_OTHER): Payer: PRIVATE HEALTH INSURANCE

## 2014-01-14 DIAGNOSIS — G4733 Obstructive sleep apnea (adult) (pediatric): Secondary | ICD-10-CM

## 2014-01-28 ENCOUNTER — Encounter: Payer: Self-pay | Admitting: Internal Medicine

## 2014-01-28 ENCOUNTER — Encounter (INDEPENDENT_AMBULATORY_CARE_PROVIDER_SITE_OTHER): Payer: Self-pay

## 2014-01-28 ENCOUNTER — Ambulatory Visit (INDEPENDENT_AMBULATORY_CARE_PROVIDER_SITE_OTHER): Payer: PRIVATE HEALTH INSURANCE | Admitting: Internal Medicine

## 2014-01-28 VITALS — BP 124/70 | HR 68 | Ht 69.0 in | Wt 348.2 lb

## 2014-01-28 DIAGNOSIS — G4733 Obstructive sleep apnea (adult) (pediatric): Secondary | ICD-10-CM

## 2014-01-28 NOTE — Patient Instructions (Signed)
Order- DME [facility]- change CPAP to auto 10-20, request download for pressure compliance   Dx OSA  Please call as needed

## 2014-01-28 NOTE — Assessment & Plan Note (Signed)
Counselling weight loss- bariatric program available
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
## 2014-01-28 NOTE — Assessment & Plan Note (Signed)
Dependent and compliant with CPAP. Feels air hungry at start.  Plan- try Auto PAP with higher low end pressure. If not comfortable we can go to fixed pressure withshort ramp. Need download.

## 2014-01-30 ENCOUNTER — Telehealth: Payer: Self-pay | Admitting: Internal Medicine

## 2014-01-30 DIAGNOSIS — G4733 Obstructive sleep apnea (adult) (pediatric): Secondary | ICD-10-CM

## 2014-01-30 NOTE — Telephone Encounter (Signed)
Spoke with pt and notified that order was placed.

## 2014-02-02 ENCOUNTER — Telehealth: Payer: Self-pay | Admitting: Internal Medicine

## 2014-02-03 NOTE — Telephone Encounter (Signed)
I called and spoke with [place] at [place]  She states that the RT changed the pt's CPAP pressure electronically through the modem at approx 1 pm yesterday  They were supposed to have informed the pt of this LMTCB for the pt

## 2014-02-03 NOTE — Telephone Encounter (Signed)
[facility] did change the pressure. The cpap machine seems to be working fine now. No need to call patient back unless needed. - [phone]

## 2014-02-11 ENCOUNTER — Telehealth: Payer: Self-pay | Admitting: Internal Medicine

## 2014-02-11 DIAGNOSIS — G4733 Obstructive sleep apnea (adult) (pediatric): Secondary | ICD-10-CM

## 2014-02-12 NOTE — Telephone Encounter (Signed)
Results have been explained to patient, pt expressed understanding.   Left Message - With Wife-need to let patient know he has good compliance on CPAP but looks as though patient could use a fix pressure of 17 based on most recent dowload.    By Lorane Gell, [place]   Order placed for change in CPAP pressure. Pt aware. Nothing further needed.

## 2014-02-13 ENCOUNTER — Other Ambulatory Visit: Payer: Self-pay | Admitting: Family Medicine

## 2014-03-19 ENCOUNTER — Ambulatory Visit: Payer: Managed Care, Other (non HMO) | Admitting: Internal Medicine

## 2014-03-19 ENCOUNTER — Encounter: Payer: Self-pay | Admitting: Internal Medicine

## 2014-03-19 VITALS — BP 110/54 | HR 69 | Ht 69.0 in | Wt 358.4 lb

## 2014-03-19 DIAGNOSIS — G4733 Obstructive sleep apnea (adult) (pediatric): Secondary | ICD-10-CM

## 2014-03-19 NOTE — Patient Instructions (Signed)
We can continue CPAP auto 10-20/ [facility]  Please call as needed
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
## 2014-03-20 ENCOUNTER — Other Ambulatory Visit: Payer: Self-pay | Admitting: Family Medicine

## 2014-04-13 ENCOUNTER — Other Ambulatory Visit: Payer: Self-pay | Admitting: Family Medicine

## 2014-06-14 ENCOUNTER — Other Ambulatory Visit: Payer: Self-pay | Admitting: Gastroenterology

## 2014-07-10 ENCOUNTER — Encounter: Payer: Self-pay | Admitting: Family Medicine

## 2014-07-10 ENCOUNTER — Ambulatory Visit (INDEPENDENT_AMBULATORY_CARE_PROVIDER_SITE_OTHER): Payer: Managed Care, Other (non HMO) | Admitting: Family Medicine

## 2014-07-10 VITALS — BP 144/92 | HR 82 | Temp 98.7°F

## 2014-07-10 DIAGNOSIS — A084 Viral intestinal infection, unspecified: Secondary | ICD-10-CM

## 2014-07-10 DIAGNOSIS — B372 Candidiasis of skin and nail: Secondary | ICD-10-CM

## 2014-07-10 DIAGNOSIS — L03116 Cellulitis of left lower limb: Secondary | ICD-10-CM

## 2014-07-10 MED ORDER — KETOCONAZOLE 2 % EX CREA: 1.0000 "application " | TOPICAL_CREAM | Freq: Two times a day (BID) | CUTANEOUS | Status: AC

## 2014-07-10 MED ORDER — DOXYCYCLINE HYCLATE 100 MG PO CAPS: 100.0000 mg | ORAL_CAPSULE | Freq: Two times a day (BID) | ORAL | Status: AC

## 2014-07-10 MED ORDER — ONDANSETRON HCL 8 MG PO TABS: 8.0000 mg | ORAL_TABLET | Freq: Three times a day (TID) | ORAL | Status: AC | PRN

## 2014-07-10 NOTE — Progress Notes (Signed)
Pre visit review using our clinic review tool, if applicable. No additional management support is needed unless otherwise documented below in the visit note. 

## 2014-07-13 NOTE — Progress Notes (Signed)
   Subjective:    Patient ID: Bobby Mcfarland, male    DOB: 06-20-1955, 59 y.o.   MRN: [id number]  HPI Here for 3 things. First he has developed an itchy red rash along the abdomen for the past 3 months. Using OTC cortisone cream. Second, he haas developed more cellulitis in the left leg. He has had this multiple times before and this is typical. It is swollen and red and mildly painful. No fever. Third he has had 2 days of diarrhe with nausea and vomiting.    Review of Systems  Constitutional: Negative for fever, chills and diaphoresis.  HENT: Negative.   Eyes: Negative.   Respiratory: Negative.   Cardiovascular: Negative.   Gastrointestinal: Positive for nausea, vomiting and diarrhea. Negative for abdominal pain, constipation, blood in stool and abdominal distention.  Skin: Positive for rash.       Objective:   Physical Exam  Constitutional: He appears well-developed and well-nourished.  HENT:  Right Ear: External ear normal.  Left Ear: External ear normal.  Nose: Nose normal.  Mouth/Throat: Oropharynx is clear and moist.  Eyes: Conjunctivae are normal.  Cardiovascular: Normal rate, regular rhythm, normal heart sounds and intact distal pulses.   Pulmonary/Chest: Effort normal and breath sounds normal.  Abdominal: Soft. Bowel sounds are normal. He exhibits no distension and no mass. There is no tenderness. There is no rebound and no guarding.  Lymphadenopathy:    He has no cervical adenopathy.  Skin:  Large band of macular erythema along a fold of skin on the lower abdomen. The left lower leg is swollen,red, warm, and tender          Assessment & Plan:  Treat the viral enteritis with fluids and use Zofran for the nausea. Treat the cellulitis with 30 days of Doxycycline. Treat the Candidiasis with ketoconazole.

## 2014-07-15 ENCOUNTER — Telehealth: Payer: Self-pay | Admitting: Family Medicine

## 2014-07-15 MED ORDER — CLINDAMYCIN HCL 300 MG PO CAPS: 300.0000 mg | ORAL_CAPSULE | Freq: Four times a day (QID) | ORAL | Status: AC

## 2014-07-15 NOTE — Telephone Encounter (Signed)
I spoke with pt and his leg is no better than when he was here on 07/10/14. He picked up the Doxycycline and has been taking it every since, he has not returned to work yet because of the pain, still hurts to even put on a sock. What else can you recommend for pt? He will need a work note also.

## 2014-07-15 NOTE — Telephone Encounter (Signed)
I spoke with pt and faxed note to [facility] at [phone], also sent new scrip e-scribe.

## 2014-07-15 NOTE — Telephone Encounter (Signed)
Pt would like a call back he said he has some questions about a medicine he is taking for his cellulitis

## 2014-07-15 NOTE — Telephone Encounter (Signed)
Stay on Doxycycline but add Clindamycin 300 mg QID, call in #[phone]. Please give him a work excuse

## 2014-07-23 ENCOUNTER — Telehealth: Payer: Self-pay | Admitting: Gastroenterology

## 2014-07-23 NOTE — Telephone Encounter (Signed)
Pt has had an increase in reflux and sharp epigastric pain, no appetite, 18 lb weight loss in 3 weeks.  Nausea and early satiety.  Scheduled with Amy for Monday at 130 pm

## 2014-07-26 ENCOUNTER — Encounter (HOSPITAL_COMMUNITY): Payer: Self-pay

## 2014-07-26 ENCOUNTER — Emergency Department (HOSPITAL_COMMUNITY)
Admission: EM | Admit: 2014-07-26 | Discharge: 2014-08-21 | Disposition: E | Payer: Managed Care, Other (non HMO) | Attending: Emergency Medicine | Admitting: Emergency Medicine

## 2014-07-26 DIAGNOSIS — I8501 Esophageal varices with bleeding: Secondary | ICD-10-CM | POA: Diagnosis not present

## 2014-07-26 DIAGNOSIS — K920 Hematemesis: Secondary | ICD-10-CM | POA: Insufficient documentation

## 2014-07-26 DIAGNOSIS — Z8669 Personal history of other diseases of the nervous system and sense organs: Secondary | ICD-10-CM | POA: Diagnosis not present

## 2014-07-26 DIAGNOSIS — Z86010 Personal history of colonic polyps: Secondary | ICD-10-CM | POA: Diagnosis not present

## 2014-07-26 DIAGNOSIS — Z79899 Other long term (current) drug therapy: Secondary | ICD-10-CM | POA: Diagnosis not present

## 2014-07-26 DIAGNOSIS — Z792 Long term (current) use of antibiotics: Secondary | ICD-10-CM | POA: Diagnosis not present

## 2014-07-26 DIAGNOSIS — Z794 Long term (current) use of insulin: Secondary | ICD-10-CM | POA: Insufficient documentation

## 2014-07-26 DIAGNOSIS — R578 Other shock: Secondary | ICD-10-CM | POA: Insufficient documentation

## 2014-07-26 DIAGNOSIS — I10 Essential (primary) hypertension: Secondary | ICD-10-CM | POA: Diagnosis not present

## 2014-07-26 DIAGNOSIS — Z8739 Personal history of other diseases of the musculoskeletal system and connective tissue: Secondary | ICD-10-CM | POA: Insufficient documentation

## 2014-07-26 DIAGNOSIS — K922 Gastrointestinal hemorrhage, unspecified: Secondary | ICD-10-CM

## 2014-07-26 DIAGNOSIS — Z872 Personal history of diseases of the skin and subcutaneous tissue: Secondary | ICD-10-CM | POA: Insufficient documentation

## 2014-07-26 DIAGNOSIS — Z9889 Other specified postprocedural states: Secondary | ICD-10-CM | POA: Insufficient documentation

## 2014-07-26 DIAGNOSIS — Z720 Tobacco use: Secondary | ICD-10-CM | POA: Diagnosis not present

## 2014-07-26 DIAGNOSIS — K625 Hemorrhage of anus and rectum: Secondary | ICD-10-CM | POA: Diagnosis present

## 2014-07-26 DIAGNOSIS — E119 Type 2 diabetes mellitus without complications: Secondary | ICD-10-CM | POA: Insufficient documentation

## 2014-07-26 LAB — COMPREHENSIVE METABOLIC PANEL
ALBUMIN: 2.1 g/dL — AB (ref 3.5–5.2)
ALK PHOS: 71 U/L (ref 39–117)
ALT: 97 U/L — ABNORMAL HIGH (ref 0–53)
AST: 109 U/L — ABNORMAL HIGH (ref 0–37)
Anion gap: 10 (ref 5–15)
BILIRUBIN TOTAL: 0.7 mg/dL (ref 0.3–1.2)
BUN: 98 mg/dL — ABNORMAL HIGH (ref 6–23)
CO2: 21 mmol/L (ref 19–32)
Calcium: 8.5 mg/dL (ref 8.4–10.5)
Chloride: 95 mmol/L — ABNORMAL LOW (ref 96–112)
Creatinine, Ser: 1.67 mg/dL — ABNORMAL HIGH (ref 0.50–1.35)
GFR calc Af Amer: 50 mL/min — ABNORMAL LOW (ref >90)
GFR, EST NON AFRICAN AMERICAN: 43 mL/min — AB (ref >90)
GLUCOSE: 395 mg/dL — AB (ref 70–99)
POTASSIUM: 5.2 mmol/L — AB (ref 3.5–5.1)
Sodium: 126 mmol/L — ABNORMAL LOW (ref 135–145)
Total Protein: 5.7 g/dL — ABNORMAL LOW (ref 6.0–8.3)

## 2014-07-26 LAB — POC OCCULT BLOOD, ED: Fecal Occult Bld: POSITIVE — AB

## 2014-07-26 LAB — LIPASE, BLOOD: Lipase: 97 U/L — ABNORMAL HIGH (ref 11–59)

## 2014-07-26 LAB — ABO/RH: ABO/RH(D): B POS

## 2014-07-26 MED ORDER — SODIUM CHLORIDE 0.9 % IV BOLUS (SEPSIS)
1000.0000 mL | Freq: Once | INTRAVENOUS | Status: AC
  Administered 2014-07-26: 1000 mL via INTRAVENOUS

## 2014-07-26 MED ORDER — PANTOPRAZOLE SODIUM 40 MG IV SOLR
40.0000 mg | Freq: Once | INTRAVENOUS | Status: AC
  Administered 2014-07-26: 40 mg via INTRAVENOUS
  Filled 2014-07-26: qty 40

## 2014-07-26 MED ORDER — DEXTROSE 5 % IV SOLN
0.5000 ug/min | INTRAVENOUS | Status: DC
  Administered 2014-07-26: 9 ug/min via INTRAVENOUS
  Filled 2014-07-26: qty 4

## 2014-07-26 MED ORDER — SODIUM CHLORIDE 0.9 % IV BOLUS (SEPSIS): 2000.0000 mL | Freq: Once | INTRAVENOUS | Status: DC

## 2014-07-26 MED FILL — Medication: Qty: 2 | Status: AC

## 2014-07-26 NOTE — ED Notes (Signed)
CBG : 312 

## 2014-07-26 NOTE — ED Notes (Signed)
REQUESTED TIM SMITH RN TO ASSIST REPOSITIONING PT. UPON ARRIVAL TO ROOM. PT UNRESPONSIVE. CODE CALLED. EDP [place] RESPONDED. CPR AT 1933 EPI GIVEN [id number]  I/O [place] EDPA TO RT LOWER LEG EPI DRIP START AT 2042 19MCG VIA I/O RT LOWER LEG. THEN CHANGED TO CENTRAL LINE AT 2049  CENTRAL LINE PLACED TO LEFT FEMORAL AT 2045  AT 2023 FAINT PULSE CONFIRMED BY [place] VERIFIED BY EDP [place] RESUMED CPR AT 2035  ETT PLACED AT [place] START OF CODE AT 2045 EDPA HANNAH TO SPEAK WITH FAMILY AND CLERGY IN [place]. EDP [place]CHERTY TO SPEAK WITH FAMILY AT 2100. AT 2103 WIFE PRESENT AT [facility] WITH FAMILY AND CLERGY. WIFE STATED TO STOP. WITH HER CONSENT, CODE ENDED. EDP DOCHERTY CALLED CODE AT 1903.

## 2014-07-26 NOTE — ED Notes (Signed)
Lab called for emergency release blood. Verbal order Tawnya Crook

## 2014-07-26 NOTE — ED Provider Notes (Signed)
CSN: [id number]     Arrival date & time 08/19/2014  1716 History   First MD Initiated Contact with Patient 08/16/2014 1725     Chief Complaint  Patient presents with  . GI Bleeding  . Nausea  . Emesis  . Abdominal Pain  . Rectal Bleeding     (Consider location/radiation/quality/duration/timing/severity/associated sxs/prior Treatment) Patient is a 59 y.o. male presenting with abdominal pain. The history is provided by the patient. No language interpreter was used.  Abdominal Pain Pain location:  Generalized (worse in epigastrium) Pain quality: aching   Pain radiates to:  Does not radiate Pain severity:  Moderate Duration:  3 weeks Timing:  Constant Progression:  Waxing and waning Chronicity:  New Relieved by:  Nothing Worsened by:  Nothing tried Ineffective treatments: home meds. Associated symptoms: anorexia, fatigue, hematemesis and vomiting   Associated symptoms: no chest pain, no chills, no constipation, no cough, no diarrhea, no dysuria, no fever, no nausea and no shortness of breath   Associated symptoms comment:  Black stools for 1 week. 2 episodes of coffee ground emesis, once yesterday, once around noon Risk factors: obesity   Risk factors: no alcohol abuse     Past Medical History  Diagnosis Date  . Hypertension   . Diabetes mellitus   . Hyperlipidemia   . Gout   . Morbidly obese   . Sleep apnea   . Eczema   . ED (erectile dysfunction)   . Osteoarthritis   . Pancytopenia   . Cirrhosis of liver     sees Dr. Oretha Caprice   . Portal hypertension   . Esophageal and gastric varices   . Ascites   . colon polyps 07/08/2008    Tubular adenoma, hyperplastic   Past Surgical History  Procedure Laterality Date  . Tonsillectomy    . Incise and drain abcess      rt thigh 10-24-2008  . Paracentesis    . Colonoscopy  Feb. 2010    per Dr. Sharlett Iles, tubular adenomas, repeat in 5 yrs    Family History  Problem Relation Age of Onset  . Diabetes    . Dementia     grandmother   History  Substance Use Topics  . Smoking status: Current Some Day Smoker    Types: Cigars  . Smokeless tobacco: Never Used     Comment: 3 times a week or less  . Alcohol Use: No    Review of Systems  Constitutional: Positive for fatigue. Negative for fever, chills, activity change and appetite change.  HENT: Negative for congestion, facial swelling, rhinorrhea and trouble swallowing.   Eyes: Negative for photophobia and pain.  Respiratory: Negative for cough, chest tightness and shortness of breath.   Cardiovascular: Negative for chest pain and leg swelling.  Gastrointestinal: Positive for vomiting, abdominal pain, blood in stool, anorexia and hematemesis. Negative for nausea, diarrhea and constipation.  Endocrine: Negative for polydipsia and polyuria.  Genitourinary: Negative for dysuria, urgency, decreased urine volume and difficulty urinating.  Musculoskeletal: Negative for back pain and gait problem.  Skin: Negative for color change, rash and wound.  Allergic/Immunologic: Negative for immunocompromised state.  Neurological: Negative for dizziness, facial asymmetry, speech difficulty, weakness, numbness and headaches.  Psychiatric/Behavioral: Negative for confusion, decreased concentration and agitation.      Allergies  Bee venom  Home Medications   Prior to Admission medications   Medication Sig Start Date End Date Taking? Authorizing Provider  allopurinol (ZYLOPRIM) 100 MG tablet Take 1 tablet (100 mg total) by  mouth daily. 09/24/13   Laurey Morale, MD  clindamycin (CLEOCIN) 300 MG capsule Take 1 capsule (300 mg total) by mouth 4 (four) times daily. 07/15/14   Laurey Morale, MD  EPINEPHrine (EPIPEN 2-PAK) 0.3 mg/0.3 mL IJ SOAJ injection Inject 0.3 mLs (0.3 mg total) into the muscle once. Patient not taking: Reported on 07/10/2014 09/24/13   Laurey Morale, MD  furosemide (LASIX) 20 MG tablet TAKE 3 TABLETS BY MOUTH DAILY    Milus Banister, MD  insulin aspart  protamine- aspart (NOVOLOG MIX 70/30) (70-30) 100 UNIT/ML injection Take 30 units at breakfast, 50 units at lunch, and 40 units at supper 09/24/13   Laurey Morale, MD  Insulin Pen Needle (B-D ULTRAFINE III SHORT PEN) 31G X 8 MM MISC Use 4 times per day 09/24/13   Laurey Morale, MD  ketoconazole (NIZORAL) 2 % cream Apply 1 application topically 2 (two) times daily. 07/10/14   Laurey Morale, MD  KLOR-CON 10 10 MEQ tablet TAKE 1 TABLET BY MOUTH EVERY DAY    Milus Banister, MD  LANTUS SOLOSTAR 100 UNIT/ML Solostar Pen INJECT 120 UNITS INTO THE SKIN AT BEDTIME. 02/13/14   Laurey Morale, MD  LANTUS SOLOSTAR 100 UNIT/ML Solostar Pen INJECT 120 UNITS INTO THE SKIN AT BEDTIME. Patient not taking: Reported on 07/10/2014 04/14/14   Laurey Morale, MD  milk thistle 175 MG tablet Take 175 mg by mouth 2 (two) times daily.    Historical Provider, MD  nadolol (CORGARD) 20 MG tablet Take 60 mg by mouth daily.  06/17/13   Milus Banister, MD  ondansetron (ZOFRAN) 8 MG tablet Take 1 tablet (8 mg total) by mouth every 8 (eight) hours as needed for nausea or vomiting. 07/10/14   Laurey Morale, MD  spironolactone (ALDACTONE) 50 MG tablet Take Two 50 mg pills every morning and One 50 mg  pill every evening 06/23/13   Milus Banister, MD  spironolactone (ALDACTONE) 50 MG tablet TAKE 2 TABLETS BY MOUTH EVERY MORNING AND 1 TABLET BY MOUTH IN THE EVENING Patient not taking: Reported on 07/10/2014 06/15/14   Milus Banister, MD  triamcinolone cream (KENALOG) 0.1 % Apply 1 application topically 3 (three) times daily. 09/24/13   Laurey Morale, MD   BP 109/48 mmHg  Pulse 72  Temp(Src) 97.6 F (36.4 C) (Oral)  Resp 17  Wt 358 lb 9.6 oz (162.66 kg)  SpO2 98% Physical Exam  Constitutional: He is oriented to person, place, and time. He appears ill. No distress.  HENT:  Head: Normocephalic and atraumatic.  Mouth/Throat: No oropharyngeal exudate.  Eyes: Pupils are equal, round, and reactive to light.  Neck: Normal range of motion. Neck  supple.  Cardiovascular: Normal rate, regular rhythm and normal heart sounds.  Exam reveals no gallop and no friction rub.   No murmur heard. Pulmonary/Chest: Effort normal and breath sounds normal. No respiratory distress. He has no wheezes. He has no rales.  Abdominal: Soft. Bowel sounds are normal. He exhibits no distension and no mass. There is no tenderness. There is no rebound and no guarding.  Genitourinary: Guaiac positive stool (small amt black stool on rectal exam, nonbleeding external hemorrhoids).  Musculoskeletal: Normal range of motion. He exhibits no edema or tenderness.  Neurological: He is alert and oriented to person, place, and time.  Skin: Skin is warm and dry. There is pallor.  Psychiatric: He has a normal mood and affect.    ED Course  CENTRAL LINE Date/Time: 08/07/2014 11:37 PM Performed by: Ernestina Patches Authorized by: Ernestina Patches Consent: The procedure was performed in an emergent situation. Verbal consent not obtained. Written consent not obtained. Time out: Immediately prior to procedure a "time out" was called to verify the correct patient, procedure, equipment, support staff and site/side marked as required. Indications: vascular access Preparation: skin prepped with Betadine Maximum sterile barriers used during central venous catheter insertion: No, emergent procedure, Steril gloves, cap ordered. Hand hygiene: hand hygiene performed prior to central venous catheter insertion Location details: left femoral Site selection rationale: Obese short neck Patient position: flat Catheter type: triple lumen Pre-procedure: landmarks identified Ultrasound guidance: yes Number of attempts: 1 Successful placement: yes Post-procedure: line sutured Assessment: blood return through all ports Patient tolerance: Patient tolerated the procedure well with no immediate complications   (including critical care time) Labs Review Labs Reviewed  COMPREHENSIVE METABOLIC  PANEL - Abnormal; Notable for the following:    Sodium 126 (*)    Potassium 5.2 (*)    Chloride 95 (*)    Glucose, Bld 395 (*)    BUN 98 (*)    Creatinine, Ser 1.67 (*)    Total Protein 5.7 (*)    Albumin 2.1 (*)    AST 109 (*)    ALT 97 (*)    GFR calc non Af Amer 43 (*)    GFR calc Af Amer 50 (*)    All other components within normal limits  LIPASE, BLOOD - Abnormal; Notable for the following:    Lipase 97 (*)    All other components within normal limits  POC OCCULT BLOOD, ED - Abnormal; Notable for the following:    Fecal Occult Bld POSITIVE (*)    All other components within normal limits  CBC  I-STAT CG4 LACTIC ACID, ED  POC OCCULT BLOOD, ED  I-STAT CHEM 8, ED  I-STAT CG4 LACTIC ACID, ED  TYPE AND SCREEN  ABO/RH    Imaging Review No results found.   EKG Interpretation None      CRITICAL CARE Performed by: Ernestina Patches, E Total critical care time: 65 Critical care time was exclusive of separately billable procedures and treating other patients. Critical care was necessary to treat or prevent imminent or life-threatening deterioration. Critical care was time spent personally by me on the following activities: development of treatment plan with patient and/or surrogate as well as nursing, discussions with consultants, evaluation of patient's response to treatment, examination of patient, obtaining history from patient or surrogate, ordering and performing treatments and interventions, ordering and review of laboratory studies, ordering and review of radiographic studies, pulse oximetry and re-evaluation of patient's condition.  Cardiopulmonary Resuscitation (CPR) Procedure Note Directed/Performed by: Ernestina Patches, E I personally directed ancillary staff and/or performed CPR in an effort to regain return of spontaneous circulation and to maintain cardiac, neuro and systemic perfusion.    MDM   Final diagnoses:  Hemorrhagic shock  Upper GI bleed  Bleeding  esophageal varices  Death    Pt is a 59 y.o. male with Pmhx as above who presents with  3 weeks of generalized abdominal pain worse in epigastrium 1 week of black stools and 2 episodes of coffee-ground emesis, one yesterday and one this afternoon.  On physical exam, patient is ill-appearing, is pale, older than stated age, morbidly obese, hypotensive.  Patient has small amount of black stool on rectal exam, but no gross melena or hematochezia.  He is not currently nauseated.  No reproducible  abdominal tenderness.  Will begin IV fluid resuscitation, type and screen, CBC, CMP, lactic acid ordered.  Patient will be given IV Protonix.  Patient's past medical history in chart mentions esophageal gastric varices, patient has no known recollection of this.  States he has never had banding procedures.    Lactic acid elevated, continuing IV fluid resuscitation, patient has had some improvement with 2 L NS  Labs have gone down, and are not resulting, for now will have to get an i-STAT chem 8, for Hb level.  Plan to initiate transfusions if low.   As staff went to get patient's Chem-8, he is unresponsive and pulseless.  CPR initiated September 01, 1932.  Patient has copious amounts of dark blood coming from mouth.  The patient was intubated by Dr. Aline Brochure.  Using the Glydescope (8-0 ett).  Emergent release O- blood ordered, and IV fluid resuscitation continued.  IO placed.  Patient remained pulseless with CPR receiving innumerable doses of epi, for roughly 50 minutes, at which time patient regained pulses for approximately 20-30 minutes, left-sided femoral central line placed by myself and epi drip initiated through central line as well as, transfusion of O- blood through level I transfuser before losing pulses again, 10-15 minutes later.  At 31-Aug-2101 after speaking with the wife about his grave illness, she is brought to the bedside and assess to stop code.  Time of death 2101/08/31.  I suspect given the large volume of hemorrhage, that  he likely had a bleeding esophageal or gastric varices.    Ernestina Patches, MD 08/19/2014 [phone]

## 2014-07-26 NOTE — ED Notes (Addendum)
RNs involved in code: Debbora Lacrosse, Ramond Marrow, Colon Flattery [facility]).  Techs: Meade Maw MD attending. Aline Brochure MD attempted intubation. Jarrett Soho Muthersbaugh PA placed central line and IO. Daniel RT Consulted Endoscopy, GI, chaplain

## 2014-07-26 NOTE — ED Notes (Signed)
Emergency Contact Password: "Morton"

## 2014-07-26 NOTE — ED Notes (Signed)
PT HAS TO HAVE CPAP AT NIGHT!!!! PLEASE CALL RESPIRATORY WHEN PT IS ASSIGNED A ROOM!

## 2014-07-26 NOTE — ED Notes (Addendum)
Pt had a 3 full units of emergency release blood and 1 partial bag of blood. Bag 1 start at 2011, end at 2024 via IV pump . Bag 2 start at 2013, end at 2027 via rapid infuser. Bag 3 start at 2041 end at 2059 via rapid infuser. Bag 4 started at 2059, end at 2103 via rapid infuser.

## 2014-07-26 NOTE — ED Notes (Signed)
Per [facility]- Pt presents with NAD. Pt c/ of generalized stomach pain x 3 weeks.  Rectal bleeding x 1 week and vomiting blood since yesterday. Pt c/o dizziness with standing. CBG 524.

## 2014-07-26 NOTE — ED Notes (Signed)
Elevated lactic acid given to Dr Tawnya Crook.Marland Kitchenklj

## 2014-07-26 NOTE — ED Provider Notes (Signed)
IO LINE INSERTION Date/Time: 08/16/2014 7:55 PM Performed by: Abigail Butts Authorized by: Abigail Butts Consent: The procedure was performed in an emergent situation. Verbal consent not obtained. Written consent not obtained. Site marked: the operative site was marked Required items: required blood products, implants, devices, and special equipment available Patient identity confirmed: arm band Time out: Immediately prior to procedure a "time out" was called to verify the correct patient, procedure, equipment, support staff and site/side marked as required. Indications: clinical deterioration, hemodynamic instability, fluid administration, medication administration and rapid vascular access Local anesthesia used: no Patient sedated: no Insertion site: right proximal tibia Site preparation: chlorhexidine Insertion device: drill device Insertion: needle was inserted through the bony cortex Number of attempts: 1 Confirmation method: stability of the needle and easy infusion of fluids Secured with: tape Patient tolerance: Patient tolerated the procedure well with no immediate complications  Abigail Butts, PA-C 07/25/2014 2142  Ernestina Patches, MD 07/28/14 [phone]

## 2014-07-26 NOTE — ED Notes (Signed)
Bed: RESB Expected date:  Expected time:  Means of arrival:  Comments: HOLD EMS

## 2014-07-26 NOTE — ED Notes (Signed)
BLOOD CONSENT SIGNED BY WIFE

## 2014-07-27 ENCOUNTER — Ambulatory Visit: Payer: Managed Care, Other (non HMO) | Admitting: Physician Assistant

## 2014-07-27 ENCOUNTER — Telehealth: Payer: Self-pay | Admitting: Family Medicine

## 2014-07-27 LAB — TYPE AND SCREEN
ABO/RH(D): B POS
ANTIBODY SCREEN: NEGATIVE
UNIT DIVISION: 0
UNIT DIVISION: 0
UNIT DIVISION: 0
Unit division: 0

## 2014-07-27 LAB — CBC
HCT: 17.4 % — ABNORMAL LOW (ref 39.0–52.0)
Hemoglobin: 6.3 g/dL — CL (ref 13.0–17.0)
MCH: 35.6 pg — ABNORMAL HIGH (ref 26.0–34.0)
MCHC: 36.2 g/dL — ABNORMAL HIGH (ref 30.0–36.0)
MCV: 98.3 fL (ref 78.0–100.0)
PLATELETS: 221 10*3/uL (ref 150–400)
RBC: 1.77 MIL/uL — ABNORMAL LOW (ref 4.22–5.81)
RDW: 15.2 % (ref 11.5–15.5)
WBC: 33.1 10*3/uL — AB (ref 4.0–10.5)

## 2014-07-27 LAB — I-STAT CG4 LACTIC ACID, ED: Lactic Acid, Venous: 7.33 mmol/L (ref 0.5–2.0)

## 2014-07-27 LAB — CBG MONITORING, ED: Glucose-Capillary: 312 mg/dL — ABNORMAL HIGH (ref 70–99)

## 2014-07-28 ENCOUNTER — Telehealth (HOSPITAL_BASED_OUTPATIENT_CLINIC_OR_DEPARTMENT_OTHER): Payer: Self-pay | Admitting: Emergency Medicine

## 2014-08-21 DIAGNOSIS — 419620001 Death: Secondary | SNOMED CT

## 2014-08-21 NOTE — Progress Notes (Signed)
Patient died in ED after a long battle to revive and sustain a heartbeat.  Wife was broken-hearted.  Although Patient was ill, several events appeared to come together to an extreme.  She was the one who stopped CPR when she came into the room.  She was accompanied by friends and they left with her.  She did not want to stay at the hospital and left ASAP after the death with her friends.  No children.  Chaplain provided presence, comfort, calm and prayer.  Loann Quill, Chaplain Pager: [phone]

## 2014-08-21 NOTE — Telephone Encounter (Signed)
Pt has passed away per wife

## 2014-08-21 DEATH — deceased

## 2014-10-06 ENCOUNTER — Ambulatory Visit: Payer: Managed Care, Other (non HMO) | Admitting: Gastroenterology

## 2015-03-22 ENCOUNTER — Ambulatory Visit: Payer: Managed Care, Other (non HMO) | Admitting: Internal Medicine
